# Patient Record
Sex: Female | Born: 1970 | Race: White | Hispanic: No | Marital: Single | Smoking: Former smoker
Health system: Southern US, Community
[De-identification: ages and names within clinical notes are randomized; demographics above are authoritative.]

## PROBLEM LIST (undated history)

## (undated) DIAGNOSIS — J45909 Unspecified asthma, uncomplicated: Secondary | ICD-10-CM

## (undated) DIAGNOSIS — R112 Nausea with vomiting, unspecified: Secondary | ICD-10-CM

## (undated) DIAGNOSIS — G5600 Carpal tunnel syndrome, unspecified upper limb: Secondary | ICD-10-CM

## (undated) DIAGNOSIS — J302 Other seasonal allergic rhinitis: Secondary | ICD-10-CM

## (undated) DIAGNOSIS — D649 Anemia, unspecified: Secondary | ICD-10-CM

## (undated) DIAGNOSIS — G709 Myoneural disorder, unspecified: Secondary | ICD-10-CM

## (undated) DIAGNOSIS — N200 Calculus of kidney: Secondary | ICD-10-CM

## (undated) DIAGNOSIS — T7840XA Allergy, unspecified, initial encounter: Secondary | ICD-10-CM

## (undated) DIAGNOSIS — Z9889 Other specified postprocedural states: Secondary | ICD-10-CM

## (undated) HISTORY — DX: Myoneural disorder, unspecified: G70.9

## (undated) HISTORY — PX: APPENDECTOMY: SHX54

## (undated) HISTORY — PX: HERNIA REPAIR: SHX51

## (undated) HISTORY — PX: TUBAL LIGATION: SHX77

## (undated) HISTORY — DX: Other specified postprocedural states: Z98.890

## (undated) HISTORY — DX: Anemia, unspecified: D64.9

## (undated) HISTORY — DX: Allergy, unspecified, initial encounter: T78.40XA

## (undated) HISTORY — DX: Unspecified asthma, uncomplicated: J45.909

## (undated) HISTORY — DX: Nausea with vomiting, unspecified: R11.2

## (undated) HISTORY — DX: Carpal tunnel syndrome, unspecified upper limb: G56.00

---

## 1999-07-22 ENCOUNTER — Other Ambulatory Visit: Admission: RE | Admit: 1999-07-22 | Discharge: 1999-07-22 | Payer: Self-pay | Admitting: Obstetrics and Gynecology

## 2000-05-18 ENCOUNTER — Other Ambulatory Visit: Admission: RE | Admit: 2000-05-18 | Discharge: 2000-05-18 | Payer: Self-pay | Admitting: Obstetrics and Gynecology

## 2001-05-03 ENCOUNTER — Ambulatory Visit: Admission: RE | Admit: 2001-05-03 | Discharge: 2001-05-03 | Payer: Self-pay | Admitting: Emergency Medicine

## 2001-05-26 ENCOUNTER — Other Ambulatory Visit: Admission: RE | Admit: 2001-05-26 | Discharge: 2001-05-26 | Payer: Self-pay | Admitting: Gynecology

## 2001-08-08 ENCOUNTER — Encounter: Payer: Self-pay | Admitting: Emergency Medicine

## 2001-08-08 ENCOUNTER — Emergency Department (HOSPITAL_COMMUNITY): Admission: EM | Admit: 2001-08-08 | Discharge: 2001-08-08 | Payer: Self-pay | Admitting: Emergency Medicine

## 2002-01-22 ENCOUNTER — Other Ambulatory Visit: Admission: RE | Admit: 2002-01-22 | Discharge: 2002-01-22 | Payer: Self-pay | Admitting: Gynecology

## 2002-09-03 ENCOUNTER — Other Ambulatory Visit: Admission: RE | Admit: 2002-09-03 | Discharge: 2002-09-03 | Payer: Self-pay | Admitting: Gynecology

## 2002-09-13 ENCOUNTER — Encounter: Admission: RE | Admit: 2002-09-13 | Discharge: 2002-12-12 | Payer: Self-pay | Admitting: Gynecology

## 2009-03-18 ENCOUNTER — Ambulatory Visit: Payer: Self-pay | Admitting: Advanced Practice Midwife

## 2009-03-18 ENCOUNTER — Inpatient Hospital Stay (HOSPITAL_COMMUNITY): Admission: AD | Admit: 2009-03-18 | Discharge: 2009-03-20 | Payer: Self-pay | Admitting: Obstetrics & Gynecology

## 2010-11-29 LAB — RH IMMUNE GLOB WKUP(>/=20WKS)(NOT WOMEN'S HOSP)

## 2010-11-29 LAB — DIFFERENTIAL
Basophils Absolute: 0 10*3/uL (ref 0.0–0.1)
Basophils Relative: 0 % (ref 0–1)
Eosinophils Absolute: 0 10*3/uL (ref 0.0–0.7)
Eosinophils Relative: 0 % (ref 0–5)
Lymphocytes Relative: 21 % (ref 12–46)
Lymphs Abs: 2.6 10*3/uL (ref 0.7–4.0)
Monocytes Absolute: 0.8 10*3/uL (ref 0.1–1.0)
Monocytes Relative: 7 % (ref 3–12)
Neutro Abs: 9.1 10*3/uL — ABNORMAL HIGH (ref 1.7–7.7)
Neutrophils Relative %: 72 % (ref 43–77)

## 2010-11-29 LAB — CBC
HCT: 24.2 % — ABNORMAL LOW (ref 36.0–46.0)
HCT: 27.8 % — ABNORMAL LOW (ref 36.0–46.0)
Hemoglobin: 7.9 g/dL — CL (ref 12.0–15.0)
Hemoglobin: 8.9 g/dL — ABNORMAL LOW (ref 12.0–15.0)
MCHC: 32.1 g/dL (ref 30.0–36.0)
MCV: 73.8 fL — ABNORMAL LOW (ref 78.0–100.0)
Platelets: 304 10*3/uL (ref 150–400)
RBC: 3.27 MIL/uL — ABNORMAL LOW (ref 3.87–5.11)
RBC: 3.76 MIL/uL — ABNORMAL LOW (ref 3.87–5.11)
RDW: 16 % — ABNORMAL HIGH (ref 11.5–15.5)
RDW: 16.4 % — ABNORMAL HIGH (ref 11.5–15.5)
WBC: 12.6 10*3/uL — ABNORMAL HIGH (ref 4.0–10.5)

## 2010-11-29 LAB — RAPID URINE DRUG SCREEN, HOSP PERFORMED
Barbiturates: NOT DETECTED
Opiates: NOT DETECTED
Tetrahydrocannabinol: NOT DETECTED

## 2010-11-29 LAB — ABO/RH: ABO/RH(D): O NEG

## 2010-11-29 LAB — TYPE AND SCREEN
ABO/RH(D): O NEG
Antibody Screen: NEGATIVE

## 2010-11-29 LAB — HEPATITIS B SURFACE ANTIGEN: Hepatitis B Surface Ag: NEGATIVE

## 2010-11-29 LAB — RUBELLA SCREEN: Rubella: 23.4 IU/mL — ABNORMAL HIGH

## 2010-11-29 LAB — RAPID HIV SCREEN (WH-MAU): Rapid HIV Screen: NONREACTIVE

## 2011-01-05 NOTE — Op Note (Signed)
Becky King, Becky King           ACCOUNT NO.:  [id number]   MEDICAL RECORD NO.:  [id number]          PATIENT TYPE:  INP   LOCATION:  9107                          FACILITY:  [facility]   PHYSICIAN:  Scheryl Darter, MD       DATE OF BIRTH:  1971/05/05   DATE OF PROCEDURE:  DATE OF DISCHARGE:  03/20/2009                               OPERATIVE REPORT   PROCEDURE:  Postpartum bilateral tubal ligation with Filshie clips.   PREOPERATIVE DIAGNOSIS:  Undesired fertility.   POSTOPERATIVE DIAGNOSIS:  Tubal sterilization.   SURGEON:  Scheryl Darter, MD   ASSISTANT:  Eula Fried, MD   ANESTHESIA:  General.   ESTIMATED BLOOD LOSS:  Negligible.   FINDINGS:  Normal tubes and ovaries.   SPECIMENS:  None.   DRAINS:  None.   COUNTS:  Correct.   OPERATIVE COURSE:  The patient gave written consent for postpartum  bilateral tubal ligation.  The patient identification was confirmed.  She was brought to the OR and general anesthesia was induced.  She was  placed in dorsal supine position.  Bladder was drained with a red rubber  catheter and abdomen sterilely prepped and draped.  A #11 blade was used  to make an infraumbilical skin incision transversely about 2 cm across.  Fascia was elevated, incised with Mayo scissors, and peritoneal cavity  was entered.  Right fallopian tube was identified and elevated and  traced to its distal fimbriated end.  Filshie clip was applied at about  the proximal one-third point of the tube with good positioning seen.  Same was done on the left fallopian tube.  Both tubes and ovaries  appeared normal.  Fascia and peritoneum were closed with a running  suture of 0 Vicryl.  Skin was closed with interrupted subcuticular  sutures with 4-0 Vicryl.  Sterile dressing was applied.  The patient  tolerated the procedure well without complications.  She was brought in  stable condition to the recovery room.     Scheryl Darter, MD  Electronically Signed    JA/MEDQ  D:   03/20/2009  T:  03/21/2009  Job:  [id number]

## 2011-10-26 ENCOUNTER — Encounter (HOSPITAL_COMMUNITY): Payer: Self-pay | Admitting: Emergency Medicine

## 2011-10-26 ENCOUNTER — Emergency Department (INDEPENDENT_AMBULATORY_CARE_PROVIDER_SITE_OTHER)
Admission: EM | Admit: 2011-10-26 | Discharge: 2011-10-26 | Disposition: A | Discharge status: Home / Self Care | Payer: 59 | Source: Home / Self Care | Attending: Emergency Medicine | Admitting: Emergency Medicine

## 2011-10-26 DIAGNOSIS — R21 Rash and other nonspecific skin eruption: Secondary | ICD-10-CM

## 2011-10-26 HISTORY — DX: Other seasonal allergic rhinitis: J30.2

## 2011-10-26 MED ORDER — TRIAMCINOLONE ACETONIDE 0.1 % EX CREA: TOPICAL_CREAM | Freq: Two times a day (BID) | CUTANEOUS | Status: AC

## 2011-10-26 MED ORDER — MUPIROCIN 2 % EX OINT: TOPICAL_OINTMENT | Freq: Three times a day (TID) | CUTANEOUS | Status: AC

## 2011-10-26 NOTE — ED Provider Notes (Signed)
History     CSN: [id number]  Arrival date & time 10/26/11  1550   First MD Initiated Contact with Patient 10/26/11 1705      Chief Complaint  Patient presents with  . Rash    (Consider location/radiation/quality/duration/timing/severity/associated sxs/prior treatment) HPI Comments: Patient reports a red, occasionally painful, occasionally itchy rash on her right lower extremity starting 2 months ago. Symptoms started after cutting herself shaving. States that the rash increased in size during this time. Was seen in another urgent care, sent home on 3 days of an unknown oral antibiotic which helped somewhat. Has been applying topical hydrocortisone and antibiotic ointment with minimal relief. Patient also reports an itchy rash over her waistline, where her jeans are, starting several weeks ago. States she switched detergents around that time. No other new lotions soaps detergents. No contacts with similar rash. No sensation of being bitten at night.  No recent medications. No nausea, vomiting, fevers. No history of diabetes.  ROS as noted in HPI. All other ROS negative.   Patient is a 41 y.o. female presenting with rash. The history is provided by the patient.  Rash  The current episode started more than 1 week ago. The problem has not changed since onset.There has been no fever. The rash is present on the right lower leg. Associated symptoms include itching and pain. Pertinent negatives include no blisters and no weeping. She has tried steriods and antibiotic cream for the symptoms. The treatment provided mild relief.    Past Medical History  Diagnosis Date  . Seasonal allergies     Past Surgical History  Procedure Date  . Tubal ligation     History reviewed. No pertinent family history.  History  Substance Use Topics  . Smoking status: Current Everyday Smoker -- 0.5 packs/day  . Smokeless tobacco: Not on file  . Alcohol Use: No    OB History    Grav Para Term Preterm  Abortions TAB SAB Ect Mult Living                  Review of Systems  Skin: Positive for itching and rash.    Allergies  Review of patient's allergies indicates no known allergies.  Home Medications   Current Outpatient Rx  Name Route Sig Dispense Refill  . FEXOFENADINE-PSEUDOEPHED ER 180-240 MG PO TB24 Oral Take 1 tablet by mouth daily.    Marland Kitchen MUPIROCIN 2 % EX OINT Topical Apply topically 3 (three) times daily. Apply after warm soak for 10 minutes 22 g 0  . TRIAMCINOLONE ACETONIDE 0.1 % EX CREA Topical Apply topically 2 (two) times daily. Apply for 2 weeks. 30 g 0    BP 128/79  Pulse 78  Temp(Src) 98.2 F (36.8 C) (Oral)  Resp 16  SpO2 98%  LMP 10/01/2011  Physical Exam  Nursing note and vitals reviewed. Constitutional: She is oriented to person, place, and time. She appears well-developed and well-nourished. No distress.  HENT:  Head: Normocephalic and atraumatic.  Eyes: Conjunctivae and EOM are normal.  Neck: Normal range of motion.  Cardiovascular: Normal rate.   Pulmonary/Chest: Effort normal.  Abdominal: She exhibits no distension.  Musculoskeletal: Normal range of motion.       Legs:      Flat, blanchable, erythema on right lower extremity. Nontender. Yellowish crusting over break in the skin. No edema. No erythema streaking up her leg.  Neurological: She is alert and oriented to person, place, and time.  Skin: Skin is warm and dry.  Rash noted.       Rash is noted on leg. Also with scattered excoriations at waistline. No other rash on torso, arms, in between fingers.  Psychiatric: She has a normal mood and affect. Her behavior is normal. Judgment and thought content normal.    ED Course  Procedures (including critical care time)  Labs Reviewed - No data to display No results found.   1. Rash       MDM  Rash on right lower extremity appears to be from chronic inflammation. Also appears to have staph infection. Will send home with medium potency steroid  and Bactroban. The other rash appears to be a contact dermatitis. We'll refer her to Dr. Derrill Kay, dermatologist on call if no improvement in 2 weeks.  Luiz Blare, MD 10/26/11 1910

## 2011-10-26 NOTE — Discharge Instructions (Signed)
Apply the medication as written. You will need to see a dermatologist if this does not resolve after treatment.   Go to www.goodrx.com to look up your medications. This will give you a list of where you can find your prescriptions at the most affordable prices.

## 2011-10-26 NOTE — ED Notes (Signed)
Bed:[facility]09<BR> Expected date:<BR> Expected time:<BR> Means of arrival:<BR> Comments:<BR>

## 2011-10-26 NOTE — ED Notes (Signed)
Pt. Has a reddened rash on her rt lower leg, and pt. Stated, some places on both side of her abdomen

## 2013-03-05 ENCOUNTER — Encounter (HOSPITAL_COMMUNITY): Payer: Self-pay | Admitting: Emergency Medicine

## 2013-03-05 DIAGNOSIS — T22219A Burn of second degree of unspecified forearm, initial encounter: Secondary | ICD-10-CM | POA: Insufficient documentation

## 2013-03-05 DIAGNOSIS — X131XXA Other contact with steam and other hot vapors, initial encounter: Secondary | ICD-10-CM | POA: Insufficient documentation

## 2013-03-05 DIAGNOSIS — X12XXXA Contact with other hot fluids, initial encounter: Secondary | ICD-10-CM | POA: Insufficient documentation

## 2013-03-05 DIAGNOSIS — F17200 Nicotine dependence, unspecified, uncomplicated: Secondary | ICD-10-CM | POA: Insufficient documentation

## 2013-03-05 DIAGNOSIS — Y92009 Unspecified place in unspecified non-institutional (private) residence as the place of occurrence of the external cause: Secondary | ICD-10-CM | POA: Insufficient documentation

## 2013-03-05 DIAGNOSIS — T310 Burns involving less than 10% of body surface: Secondary | ICD-10-CM | POA: Insufficient documentation

## 2013-03-05 DIAGNOSIS — Y93G1 Activity, food preparation and clean up: Secondary | ICD-10-CM | POA: Insufficient documentation

## 2013-03-05 NOTE — ED Notes (Signed)
PT. REPORTS HOT COOKING OIL SPLATTERED AT HER RIGHT ANTERIOR WRIST Becky King THIS EVENING WHILE FRYING , PRESENTS WITH REDDNESS /BLISTERS AT RIGHT ANTERIOR FOREARM/WRIST .

## 2013-03-05 NOTE — ED Notes (Signed)
COLD COMPRESS APPLIED BY PT. PTA.

## 2013-03-06 ENCOUNTER — Emergency Department (HOSPITAL_COMMUNITY)
Admission: EM | Admit: 2013-03-06 | Discharge: 2013-03-06 | Disposition: A | Discharge status: Home / Self Care | Payer: 59 | Attending: Emergency Medicine | Admitting: Emergency Medicine

## 2013-03-06 DIAGNOSIS — T2210XA Burn of first degree of shoulder and upper limb, except wrist and hand, unspecified site, initial encounter: Secondary | ICD-10-CM

## 2013-03-06 DIAGNOSIS — T2220XA Burn of second degree of shoulder and upper limb, except wrist and hand, unspecified site, initial encounter: Secondary | ICD-10-CM

## 2013-03-06 MED ORDER — SILVER SULFADIAZINE 1 % EX CREA
TOPICAL_CREAM | Freq: Once | CUTANEOUS | Status: AC
  Administered 2013-03-06: 02:00:00 via TOPICAL
  Filled 2013-03-06: qty 85

## 2013-03-06 MED ORDER — OXYCODONE-ACETAMINOPHEN 5-325 MG PO TABS: 1.0000 | ORAL_TABLET | ORAL | Status: DC | PRN

## 2013-03-06 NOTE — ED Provider Notes (Signed)
History    CSN: [id number] Arrival date & time 03/05/13  2155  First MD Initiated Contact with Patient 03/06/13 0138     Chief Complaint  Patient presents with  . Burn   HPI  History provided by the patient. Patient is a 42 year old female with no significant PMH presenting with burns from cooking to right forearm. Patient was cooking in frying foods around 9 PM when she suddenly heard a popping from the pain and with splashing of the oil onto her right forearm. She immediately rinsed in cold water. Since that time she has had redness of the skin and a few small blisters of the skin to the right wrist area. Skin and arm are tender. She has not used any other treatments or symptoms. She denies any weakness or numbness to the hand or fingers. No other aggravating or alleviating factors. No other associated symptoms.     Past Medical History  Diagnosis Date  . Seasonal allergies    Past Surgical History  Procedure Laterality Date  . Tubal ligation    . Appendectomy    . Hernia repair     No family history on file. History  Substance Use Topics  . Smoking status: Current Every Day Smoker -- 0.50 packs/day  . Smokeless tobacco: Not on file  . Alcohol Use: No   OB History   Grav Para Term Preterm Abortions TAB SAB Ect Mult Living                 Review of Systems  Constitutional: Negative for fever, chills and diaphoresis.  Skin:       Burn of right forearm  All other systems reviewed and are negative.    Allergies  Review of patient's allergies indicates no known allergies.  Home Medications   Current Outpatient Rx  Name  Route  Sig  Dispense  Refill  . fexofenadine-pseudoephedrine (ALLEGRA-D 24) 180-240 MG per 24 hr tablet   Oral   Take 1 tablet by mouth every morning.          . polyvinyl alcohol (LIQUIFILM TEARS) 1.4 % ophthalmic solution   Both Eyes   Place 1 drop into both eyes as needed. [employer] Lomb For dry eyes          BP 135/73  Pulse 101   Temp(Src) 98.1 F (36.7 C) (Oral)  Resp 16  SpO2 99%  LMP 02/20/2013 Physical Exam  Nursing note and vitals reviewed. Constitutional: She is oriented to person, place, and time. She appears well-developed and well-nourished. No distress.  HENT:  Head: Normocephalic.  Cardiovascular: Normal rate and regular rhythm.   Pulmonary/Chest: Effort normal and breath sounds normal. No respiratory distress. She has no wheezes.  Abdominal: Soft.  Musculoskeletal:  Normal full range of motion of the right wrist and digits. Normal radial pulse. Normal sensation and cap refill to all fingers. Normal grip strength. There are burns to the anterior right forearm, see skin exam. Mild tenderness over the burn.  Neurological: She is alert and oriented to person, place, and time.  Skin: Skin is warm and dry. No rash noted.     There is a diffuse area of erythema to the anterior right forearm. There are 2 small bulla to the right wrist area. Several satellite small circular areas of erythema nearby on the anterior and lateral forearm.  Psychiatric: She has a normal mood and affect. Her behavior is normal.    ED Course  Procedures  1. Burn of arm, right, first degree, initial encounter   2. Burn of arm, right, second degree, initial encounter     MDM  Patient seen and evaluated. Patient appears well in no acute distress. She has slightly larger diffuse burn over the anterior right forearm with several satellite circular spots. No circumferential burn. The majority of burn appears first degree    Angus Seller, PA-C 03/06/13 [phone]

## 2013-03-07 NOTE — ED Provider Notes (Signed)
Medical screening examination/treatment/procedure(s) were performed by non-physician practitioner and as supervising physician I was immediately available for consultation/collaboration.   Julie Manly, MD 03/07/13 0901 

## 2014-02-23 ENCOUNTER — Emergency Department (HOSPITAL_COMMUNITY)
Admission: EM | Admit: 2014-02-23 | Discharge: 2014-02-23 | Disposition: A | Discharge status: Home / Self Care | Payer: 59 | Attending: Emergency Medicine | Admitting: Emergency Medicine

## 2014-02-23 ENCOUNTER — Encounter (HOSPITAL_COMMUNITY): Payer: Self-pay | Admitting: Emergency Medicine

## 2014-02-23 ENCOUNTER — Emergency Department (HOSPITAL_COMMUNITY): Payer: 59

## 2014-02-23 DIAGNOSIS — Z91040 Latex allergy status: Secondary | ICD-10-CM | POA: Insufficient documentation

## 2014-02-23 DIAGNOSIS — J40 Bronchitis, not specified as acute or chronic: Secondary | ICD-10-CM | POA: Insufficient documentation

## 2014-02-23 DIAGNOSIS — Z792 Long term (current) use of antibiotics: Secondary | ICD-10-CM | POA: Insufficient documentation

## 2014-02-23 DIAGNOSIS — F17200 Nicotine dependence, unspecified, uncomplicated: Secondary | ICD-10-CM | POA: Insufficient documentation

## 2014-02-23 DIAGNOSIS — Z79899 Other long term (current) drug therapy: Secondary | ICD-10-CM | POA: Insufficient documentation

## 2014-02-23 DIAGNOSIS — J069 Acute upper respiratory infection, unspecified: Secondary | ICD-10-CM | POA: Insufficient documentation

## 2014-02-23 LAB — BASIC METABOLIC PANEL
Anion gap: 14 (ref 5–15)
BUN: 12 mg/dL (ref 6–23)
CHLORIDE: 103 meq/L (ref 96–112)
CO2: 23 meq/L (ref 19–32)
CREATININE: 0.44 mg/dL — AB (ref 0.50–1.10)
Calcium: 8.9 mg/dL (ref 8.4–10.5)
GFR calc Af Amer: >90 mL/min (ref >90)
GFR calc non Af Amer: >90 mL/min (ref >90)
Glucose, Bld: 98 mg/dL (ref 70–99)
Potassium: 4.4 mEq/L (ref 3.7–5.3)
Sodium: 140 mEq/L (ref 137–147)

## 2014-02-23 LAB — CBC
HEMATOCRIT: 34.2 % — AB (ref 36.0–46.0)
Hemoglobin: 10.7 g/dL — ABNORMAL LOW (ref 12.0–15.0)
MCH: 27.4 pg (ref 26.0–34.0)
MCHC: 31.3 g/dL (ref 30.0–36.0)
MCV: 87.7 fL (ref 78.0–100.0)
Platelets: 469 10*3/uL — ABNORMAL HIGH (ref 150–400)
RBC: 3.9 MIL/uL (ref 3.87–5.11)
RDW: 16.7 % — ABNORMAL HIGH (ref 11.5–15.5)
WBC: 9.7 10*3/uL (ref 4.0–10.5)

## 2014-02-23 LAB — RAPID STREP SCREEN (MED CTR MEBANE ONLY): Streptococcus, Group A Screen (Direct): NEGATIVE

## 2014-02-23 LAB — I-STAT TROPONIN, ED: Troponin i, poc: 0 ng/mL (ref 0.00–0.08)

## 2014-02-23 MED ORDER — ALBUTEROL SULFATE HFA 108 (90 BASE) MCG/ACT IN AERS
2.0000 | INHALATION_SPRAY | Freq: Once | RESPIRATORY_TRACT | Status: AC
  Administered 2014-02-23: 2 via RESPIRATORY_TRACT
  Filled 2014-02-23: qty 6.7

## 2014-02-23 MED ORDER — AZITHROMYCIN 250 MG PO TABS: 250.0000 mg | ORAL_TABLET | Freq: Every day | ORAL | Status: DC

## 2014-02-23 NOTE — ED Provider Notes (Signed)
CSN: [id number]     Arrival date & time 02/23/14  1456 History   First MD Initiated Contact with Patient 02/23/14 1503     Chief Complaint  Patient presents with  . URI     (Consider location/radiation/quality/duration/timing/severity/associated sxs/prior Treatment) Patient is a 43 y.o. female presenting with URI. The history is provided by the patient. No language interpreter was used.  URI Presenting symptoms: fever and sore throat   Associated symptoms: headaches   Associated symptoms: no neck pain   Becky King is a 43 y/o F with PMhx of seasonal allergies presenting to the ED with chest congestion, productive cough, nasal congestion, sinus pressure, sore throat is been ongoing for the past 4 days. Patient reports that when she coughs there is a yellowish colored sputum. Stated that she's been having sinus pressure and pressure around the eyes have since nasal congestion began. Reported that when she coughs his tightness in her chest-reported that she only experiences chest discomfort when she coughs. Stated that she has intermittent shortness of breath particularly with cough. Reported generalized body aches. Stated that she's been having intermittent fevers, reported that she had a fever last night at approximately 102F. Stated that she's been using over-the-counter medications such as Alka-Seltzer, Tylenol. Stated that her son has similar symptoms. Denied blurred vision, sudden loss of vision, abdominal pain, nausea, vomiting, diarrhea, leg swelling, travel, birth control, neck pain, neck stiffness, difficulty swallowing, hemoptysis. PCP none  Past Medical History  Diagnosis Date  . Seasonal allergies    Past Surgical History  Procedure Laterality Date  . Tubal ligation    . Appendectomy    . Hernia repair     History reviewed. No pertinent family history. History  Substance Use Topics  . Smoking status: Current Every Day Smoker -- 0.50 packs/day  . Smokeless tobacco:  Not on file  . Alcohol Use: No   OB History   Grav Para Term Preterm Abortions TAB SAB Ect Mult Living                 Review of Systems  Constitutional: Positive for fever. Negative for chills.  HENT: Positive for sinus pressure and sore throat. Negative for trouble swallowing.   Respiratory: Positive for chest tightness and shortness of breath.   Cardiovascular: Negative for chest pain.  Gastrointestinal: Negative for nausea, vomiting, abdominal pain and diarrhea.  Musculoskeletal: Negative for back pain and neck pain.  Neurological: Positive for headaches. Negative for weakness.      Allergies  Latex  Home Medications   Prior to Admission medications   Medication Sig Start Date End Date Taking? Authorizing Provider  acetaminophen (TYLENOL) 500 MG tablet Take 500 mg by mouth every 6 (six) hours as needed for moderate pain.   Yes Historical Provider, MD  fexofenadine-pseudoephedrine (ALLEGRA-D 24) 180-240 MG per 24 hr tablet Take 1 tablet by mouth every morning.    Yes Historical Provider, MD  guaiFENesin (ROBITUSSIN) 100 MG/5ML liquid Take 200 mg by mouth 3 (three) times daily as needed for cough.   Yes Historical Provider, MD  OVER THE COUNTER MEDICATION Apply 1 application topically 2 (two) times daily. Psoriasis lotion   Yes Historical Provider, MD  OVER THE COUNTER MEDICATION Apply 1 application topically daily. Psoriasis scalp solution   Yes Historical Provider, MD  polyvinyl alcohol (LIQUIFILM TEARS) 1.4 % ophthalmic solution Place 1 drop into both eyes as needed. [employer]ch [employer] Lomb For dry eyes   Yes Historical Provider, MD  Pseudoeph-CPM-DM-APAP Clarene Reamer(ALKA-SELTZER  PLUS-D SINUS/COLD) 30-2-10-325 MG CAPS Take 2 capsules by mouth daily as needed (for sinus).   Yes Historical Provider, MD  azithromycin (ZITHROMAX) 250 MG tablet Take 1 tablet (250 mg total) by mouth daily. Take first 2 tablets together, then 1 every day until finished. 02/23/14   Siddalee Vanderheiden, PA-C   BP 116/53   Pulse 88  Temp(Src) 98.2 F (36.8 C) (Oral)  Resp 18  Ht 5\' 2"  (1.575 m)  Wt 170 lb (77.111 kg)  BMI 31.09 kg/m2  SpO2 100%  LMP 02/23/2014 Physical Exam  Nursing note and vitals reviewed. Constitutional: She is oriented to person, place, and time. She appears well-developed and well-nourished. No distress.  Patient sitting comfortably upright in bed  HENT:  Head: Normocephalic and atraumatic.  Mouth/Throat: Oropharynx is clear and moist. No oropharyngeal exudate.  Bilateral tonsillar adenopathy identified with negative erythema, exudate, petechiae. Negative posterior oropharynx edema and erythema noted. Negative trismus. Uvula midline with symmetrical elevation. Negative deviations uvula.  Eyes: Conjunctivae and EOM are normal. Pupils are equal, round, and reactive to light. Right eye exhibits no discharge. Left eye exhibits no discharge.  Neck: Normal range of motion. Neck supple. No tracheal deviation present.  Negative neck stiffness Negative nuchal rigidity Negative cervical lymphadenopathy Negative meningeal signs Negative pain upon palpation to the C-spine  Cardiovascular: Normal rate, regular rhythm and normal heart sounds.  Exam reveals no friction rub.   No murmur heard. Cap refill less than 3 seconds Negative swelling and pitting edema identified to lower extremities bilaterally  Pulmonary/Chest: Effort normal and breath sounds normal. No respiratory distress. She has no wheezes. She has no rales. She exhibits no tenderness.  Patient is able to speak in full sentences without difficulty Negative use of accessory muscles Negative stridor  Musculoskeletal: Normal range of motion. She exhibits no tenderness.  Lymphadenopathy:    She has no cervical adenopathy.  Neurological: She is alert and oriented to person, place, and time. No cranial nerve deficit. She exhibits normal muscle tone. Coordination normal.  Cranial nerves III-XII grossly intact Strength 5+/5+ to upper and  lower extremities bilaterally with resistance applied, equal distribution noted Equal grip strength bilaterally Negative facial drooping Negative slurred speech Negative aphasia Patient follows commands well Gait proper, proper balance - negative sway, negative drift, negative step-offs  Skin: Skin is warm and dry. No rash noted. She is not diaphoretic. No erythema.  Psychiatric: She has a normal mood and affect. Her behavior is normal. Thought content normal.    ED Course  Procedures (including critical care time)  Results for orders placed during the hospital encounter of 02/23/14  RAPID STREP SCREEN      Result Value Ref Range   Streptococcus, Group A Screen (Direct) NEGATIVE  NEGATIVE  CBC      Result Value Ref Range   WBC 9.7  4.0 - 10.5 K/uL   RBC 3.90  3.87 - 5.11 MIL/uL   Hemoglobin 10.7 (*) 12.0 - 15.0 g/dL   HCT 45.434.2 (*) 09.836.0 - 11.946.0 %   MCV 87.7  78.0 - 100.0 fL   MCH 27.4  26.0 - 34.0 pg   MCHC 31.3  30.0 - 36.0 g/dL   RDW 14.716.7 (*) 82.911.5 - 56.215.5 %   Platelets 469 (*) 150 - 400 K/uL  BASIC METABOLIC PANEL      Result Value Ref Range   Sodium 140  137 - 147 mEq/L   Potassium 4.4  3.7 - 5.3 mEq/L   Chloride 103  96 -  112 mEq/L   CO2 23  19 - 32 mEq/L   Glucose, Bld 98  70 - 99 mg/dL   BUN 12  6 - 23 mg/dL   Creatinine, Ser 1.19 (*) 0.50 - 1.10 mg/dL   Calcium 8.9  8.4 - 14.7 mg/dL   GFR calc non Af Amer >90  >90 mL/min   GFR calc Af Amer >90  >90 mL/min   Anion gap 14  5 - 15  I-STAT TROPOININ, ED      Result Value Ref Range   Troponin i, poc 0.00  0.00 - 0.08 ng/mL   Comment 3             Labs Review Labs Reviewed  CBC - Abnormal; Notable for the following:    Hemoglobin 10.7 (*)    HCT 34.2 (*)    RDW 16.7 (*)    Platelets 469 (*)    All other components within normal limits  BASIC METABOLIC PANEL - Abnormal; Notable for the following:    Creatinine, Ser 0.44 (*)    All other components within normal limits  RAPID STREP SCREEN  CULTURE, GROUP A  STREP  I-STAT TROPOININ, ED    Imaging Review Dg Chest 2 View  02/23/2014   CLINICAL DATA:  1-2 week history of weakness and chest congestion; history of tobacco use.  EXAM: CHEST  2 VIEW  COMPARISON:  None.  FINDINGS: The lungs are well-expanded and clear. The heart and mediastinal structures are normal. There is no pleural effusion or pneumothorax. The bony thorax is unremarkable.  IMPRESSION: There is no acute cardiopulmonary abnormality.   Electronically Signed   By: David  [place]   On: 02/23/2014 16:46     EKG Interpretation   Date/Time:  Saturday February 23 2014 16:23:22 EDT Ventricular Rate:  93 PR Interval:  135 QRS Duration: 86 QT Interval:  364 QTC Calculation: 453 R Axis:   42 Text Interpretation:  Sinus rhythm Low voltage, precordial leads RSR' in  V1 or V2, right VCD or RVH Nonspecific T abnormalities, anterior leads No  prior for comparision Confirmed by DOCHERTY  MD, MEGAN [phone]) on 02/23/2014  4:27:45 PM      MDM   Final diagnoses:  URI (upper respiratory infection)  Bronchitis    Filed Vitals:   02/23/14 1504 02/23/14 1615 02/23/14 1624 02/23/14 1806  BP: 119/64 102/39 112/57 116/53  Pulse: 101 86 90 88  Temp: 98.2 F (36.8 C)     TempSrc: Oral     Resp: 16  20 18   Height: 5\' 2"  (1.575 m)     Weight: 170 lb (77.111 kg)     SpO2: 98% 98% 97% 100%   EKG noted normal sinus rhythm with a heart rate of 93 beats per minute-nonspecific T wave abnormalities. I-STAT troponin negative elevation. CBC negative elevated white blood cell count. BMP noted low creatinine 0.44. Electrolytes balanced. Rapid strep negative. Chest x-ray negative for acute cardiopulmonary abnormalities. Doubt streptococcal pharyngitis. Doubt retropharyngeal abscess. Doubt peritonsillar abscess. Suspicion to be upper respiratory infection, possible bronchitis. Discussed case with attending physician, Dr. Nonda Lou - agreed to plan of discharge. Patient stable, afebrile. Patient not septic  appearing. Discharged patient. Discussed with patient to rest and stay hydrated. Discussed with patient to avoid any physical stress activity. Discussed smoking cessation with patient in great detail. Discussed with patient to closely monitor symptoms and if symptoms are to worsen or change to report back to the ED - strict return instructions given.  Patient agreed to plan of care, understood, all questions answered.   Raymon Mutton, PA-C 02/23/14 1820  Raymon Mutton, PA-C 02/23/14 1823

## 2014-02-23 NOTE — Discharge Instructions (Signed)
Please call your doctor for a followup appointment within 24-48 hours. When you talk to your doctor please let them know that you were seen in the emergency department and have them acquire all of your records so that they can discuss the findings with you and formulate a treatment plan to fully care for your new and ongoing problems. Please call and set-up an appointment with [facility] to be seen and assessed Please take antibiotics as prescribed and on a full stomach Please avoid smoking for this can lead to worsening symptoms and is dangerous for your health  Please use albuterol as needed for shortness of breath  Please continue to monitor symptoms and if symptoms are to worsen or change (fever greater than 101, chills, sweating, nausea, vomiting, chest pain, shortness of breath, difficulty breathing, numbness, tingling, nausea, vomiting, stomach pain, weakness, fainting, leg swelling, coughing up blood) please report back to the ED immediately    Bronchitis Bronchitis is swelling (inflammation) of the air tubes leading to your lungs (bronchi). This causes mucus and a cough. If the swelling gets bad, you may have trouble breathing. HOME CARE   Rest.  Drink enough fluids to keep your pee (urine) clear or pale yellow (unless you have a condition where you have to watch how much you drink).  Only take medicine as told by your doctor. If you were given antibiotic medicines, finish them even if you start to feel better.  Avoid smoke, irritating chemicals, and strong smells. These make the problem worse. Quit smoking if you smoke. This helps your lungs heal faster.  Use a cool mist humidifier. Change the water in the humidifier every day. You can also sit in the bathroom with hot shower running for 5-10 minutes. Keep the door closed.  See your health care provider as told.  Wash your hands often. GET HELP IF: Your problems do not get better after 1 week. GET HELP RIGHT AWAY  IF:   Your fever gets worse.  You have chills.  Your chest hurts.  Your problems breathing get worse.  You have blood in your mucus.  You pass out (faint).  You feel light-headed.  You have a bad headache.  You throw up (vomit) again and again. MAKE SURE YOU:  Understand these instructions.  Will watch your condition.  Will get help right away if you are not doing well or get worse. Document Released: 01/26/2008 Document Revised: 08/14/2013 Document Reviewed: 04/03/2013 [facility] Patient Information 2015 [place], [place]. This information is not intended to replace advice given to you by your health care provider. Make sure you discuss any questions you have with your health care provider. Upper Respiratory Infection, Adult An upper respiratory infection (URI) is also known as the common cold. It is often caused by a type of germ (virus). Colds are easily spread (contagious). You can pass it to others by kissing, coughing, sneezing, or drinking out of the same glass. Usually, you get better in 1 or 2 weeks.  HOME CARE   Only take medicine as told by your doctor.  Use a warm mist humidifier or breathe in steam from a hot shower.  Drink enough water and fluids to keep your pee (urine) clear or pale yellow.  Get plenty of rest.  Return to work when your temperature is back to normal or as told by your doctor. You may use a face mask and wash your hands to stop your cold from spreading. GET HELP RIGHT AWAY IF:  After the first few days, you feel you are getting worse.  You have questions about your medicine.  You have chills, shortness of breath, or brown or red spit (mucus).  You have yellow or brown snot (nasal discharge) or pain in the face, especially when you bend forward.  You have a fever, puffy (swollen) neck, pain when you swallow, or white spots in the back of your throat.  You have a bad headache, ear pain, sinus pain, or chest pain.  You have a  high-pitched whistling sound when you breathe in and out (wheezing).  You have a lasting cough or cough up blood.  You have sore muscles or a stiff neck. MAKE SURE YOU:   Understand these instructions.  Will watch your condition.  Will get help right away if you are not doing well or get worse. Document Released: 01/26/2008 Document Revised: 11/01/2011 Document Reviewed: 12/14/2010 [facility] Patient Information 2015 [place], [place]. This information is not intended to replace advice given to you by your health care provider. Make sure you discuss any questions you have with your health care provider.   Emergency Department Resource Guide 1) Find a Doctor and Pay Out of Pocket Although you won't have to find out who is covered by your insurance plan, it is a good idea to ask around and get recommendations. You will then need to call the office and see if the doctor you have chosen will accept you as a new patient and what types of options they offer for patients who are self-pay. Some doctors offer discounts or will set up payment plans for their patients who do not have insurance, but you will need to ask so you aren't surprised when you get to your appointment.  2) Contact Your Local Health Department Not all health departments have doctors that can see patients for sick visits, but many do, so it is worth a call to see if yours does. If you don't know where your local health department is, you can check in your phone book. The CDC also has a tool to help you locate your state's health department, and many state websites also have listings of all of their local health departments.  3) Find a Walk-in Clinic If your illness is not likely to be very severe or complicated, you may want to try a walk in clinic. These are popping up all over the country in pharmacies, drugstores, and shopping centers. They're usually staffed by nurse practitioners or physician assistants that have been trained to  treat common illnesses and complaints. They're usually fairly quick and inexpensive. However, if you have serious medical issues or chronic medical problems, these are probably not your best option.  No Primary Care Doctor: - Call Health Connect at  [phone] - they can help you locate a primary care doctor that  accepts your insurance, provides certain services, etc. - Physician Referral Service- [phone]  Chronic Pain Problems: Organization         Address  Phone   Notes  [facility]  [phone] Patients need to be referred by their primary care doctor.   Medication Assistance: Organization         Address  Phone   Notes  [facility] Medication [facility] [address] [place] [place], [place] [address] [phone] --Must be a resident of [facility] -- Must have NO insurance coverage whatsoever (no Medicaid/ Medicare, etc.) -- The pt. MUST have a primary care doctor  that directs their care regularly and follows them in the community   [facility]ist  [phone]   [employer]  [phone]    Agencies that provide inexpensive medical care: Organization         Address  Phone   Notes  [facility]  [phone]   [facility]    [phone]   [facility] [address] [place], [place] [address] [phone]   [facility] [address] [place], [place] [phone]   Planned Parenthood    [phone]   [facility]    [phone]   [facility]  [address], [place] Phone:  [phone], Fax:  [phone] Hours of Operation:  9 am - 6 pm, M-F.  Also accepts Medicaid/Medicare and self-pay.  [facility] for [facility]  [address], [place] Phone: [phone], Fax: [phone]. Hours of Operation:  8:30 am - 5:30 pm, M-F.  Also accepts Medicaid and self-pay.  [facility]  [place] [address] [place] Phone: [phone]   [facility] [address] Natasha Bence [place], [place] [phone], Ext. [phone] Mondays & Thursdays: 7-9 AM.  First 15 patients are seen on a first come, first serve basis.    Medicaid-accepting [facility] Providers:  Organization         Address  Phone   Notes  [facility] [address], [place] [phone] Also accepts self-pay patients.  [facility] [address] Laurell Josephs [place], [place]  [phone]   [facility] [address], [place] [phone]   [facility] [address], [place] [phone]   Renaye Rakers [address], [place]   [phone] Only accepts [place] Access [place] patients after they have their name applied to their card.   Self-Pay (no insurance) in [facility] [place] [facility]:  Organization         Address  Phone   Notes  Sickle Cell Patients, [facility] [address] [place], [place] [phone]   [facility] [address] [place], [place] [phone]   [facility] [place]  [address], [place] [phone]   [facility]/Dr. Osei-Bonsu  [address] [place] or [address], [place] [phone] Phone number for both [place] and [place] locations is the same.  [facility]gent Medical and [facility] [address], [place] [phone]   [facility] [address], [place] or [address] [phone]   [facility] [address], [place] [phone], phone; [phone], fax Sees patients 1st and 3rd Saturday of every month.  Must not qualify for public or private insurance (i.e. Medicaid, Medicare, [place] Health Choice, Veterans' Benefits)  Household income should be no more than 200%  of the poverty level The clinic cannot treat you if you are pregnant or think you are pregnant  Sexually transmitted diseases are not treated at the clinic.    Dental Care: Organization         Address  Phone  Notes  [facility] [address] [place], [place] [phone] Accepts children  up to age 45 who are enrolled in Medicaid or [place] Health Choice; pregnant women with a Medicaid card; and children who have applied for Medicaid or [place] Health Choice, but were declined, whose parents can pay a reduced fee at time of service.  [facility] [place]  [address], [place] [phone] Accepts children up to age 45 who are enrolled in [place] or [place] Health Choice; pregnant women with a Medicaid card; and children who have applied for Medicaid or [place] Health Choice, but were declined, whose parents can pay a reduced fee at time of service.  [facility]  [address], [place] [phone] Patients are seen by appointment only. Walk-ins are not accepted. [facility] will see patients 45 years of age and older. Monday - Tuesday (8am-5pm) Most Wednesdays (8:30-5pm) $30 per visit, cash only  [facility]  [address], [facility] [phone] Patients are seen by appointment only. Walk-ins are not accepted. [facility] will see patients 45 years of age and older. One Wednesday Evening (Monthly: Volunteer Based).  $30 per visit, cash only  [employer] [facility] [facility]  [phone] for adults; Children under age 384, call Graduate Pediatric Dentistry at [phone]. Children aged 244-14, please call [phone] to request a pediatric application.  Dental services are provided in all areas of dental care including fillings, crowns and bridges, complete and partial dentures, implants, gum treatment, root canals, and  extractions. Preventive care is also provided. Treatment is provided to both adults and children. Patients are selected via a lottery and there is often a waiting list.   [facility] [address], [place]  [phone] [email or web address]   [facility] [address], [place], [place] [phone], Ext. [phone] Second and Fourth Thursday of each month, opens at 6:30 AM; Clinic ends at 9 AM.  Patients are seen on a first-come first-served basis, and a limited number are seen during each clinic.   [facility]  [address], [place], [place] [phone]) [phone]   Eligibility Requirements You must have lived in [place], [place], or [place] counties for at least the last three months.   You cannot be eligible for state or federal sponsored [place] Cityhealthcare insurance, including CIGNAVeterans Administration, [place]icaid, or [employer] EntertainmentMedicare.   You generally cannot be eligible for healthcare insurance through your employer.    How to apply: Eligibility screenings are held every Tuesday and Wednesday afternoon from 1:00 pm until 4:00 pm. You do not need an appointment for the interview!  [facility] [address], [place], [place] [phone]   [facility]  [phone]   [facility]  [phone]   [facility]  [phone]    Behavioral Health Resources in the Community: Intensive Outpatient Programs Organization         Address  Phone  Notes  [facility] [address], [place], [place] [phone]   [facility] [address], [place], [place] [phone]   [facility] [address], [place], [place]  [phone]   [facility] [address],  [place], [place] [phone] or [phone]   Substance Abuse Resources Organization         Address  Phone  Notes  [facility]  [phone]  [facility]  [phone]   [facility] [place]  [phone]   Floydene Flock  [phone]   Residential & Outpatient Substance Abuse Program  [phone]   Psychological Services Organization         Address  Phone  Notes  [facility]  [phone]   [facility]  [phone]   [facility] [place] [facility] [address] [place] [phone] or [phone]    Mobile Crisis Teams Organization         Address  Phone  Notes  [facility], Mobile Crisis Care Unit  [phone]   [facility]  [address] [place], [place] [phone]   Doristine Locks [address] [place] [phone]    Self-Help/Support Groups Organization         Address  Phone             Notes  [facility] [place] - variety of support groups  336- I7437963 Call for more information  Narcotics Anonymous (NA), [facility] [address], [employer] [place]  2 meetings at this location   Statistician         Address  Phone  Notes  [facility] [address],    [place]  [phone]   [facility]  [address] [place] [address], [place], [place] [phone]   [facility] [address] [place], [place] [phone] Admissions: 8am-3pm M-F  [facility] [address]    [place], [place] [phone]   [facility] [address] [place], [place], [place] [phone]   [facility] [address],  [place], [place] [phone]   [facility] - Intensive Outpatient [address] Laurell Josephs [address], [place], [place] [phone]   [facility] [address] [place]  [place], [place] [phone] or [phone]   [facility] ([facility]) [address] [place], [place] [phone] Accepts Medicaid  [facility] [address],   [place] [phone] Substance Abuse/Addiction Treatment   [facility] Organization         Address  Phone  Notes  [facility]  [phone]   Angie Fava, PhD [address] Ervin Knack [place], [place]   [phone] or [phone]   [facility] Behavioral   [address] [place], [place] [phone]   [facility] [address] [place], [place] [phone] Insurance/Medicaid/sponsorship through [facility] [address]                                    [place], [place] [phone] Therapy/tele-psych/case  [facility] [address] [place], [place] [phone]    Dr. Lolly Mustache  [phone]   [facility]  [facility] Dept. 1) [address], [place] 2) [address] [place] 3)  [address], [place] [phone]  [phone]   [facility] Child Abuse Hotline (660)823-5358 or [phone] (After Hours)

## 2014-02-23 NOTE — ED Notes (Signed)
Phlebotomy at the bedside  

## 2014-02-23 NOTE — ED Notes (Signed)
She states shes had a productive cough, nasal congestion, sore throat, chills, and body aches x 3 days. She took OTC col/allergy meds with no relief.

## 2014-02-24 NOTE — ED Provider Notes (Signed)
Medical screening examination/treatment/procedure(s) were performed by non-physician practitioner and as supervising physician I was immediately available for consultation/collaboration.  Megan E Docherty, MD 02/24/14 1219 

## 2014-02-25 LAB — CULTURE, GROUP A STREP

## 2016-04-25 ENCOUNTER — Emergency Department (HOSPITAL_COMMUNITY)
Admission: EM | Admit: 2016-04-25 | Discharge: 2016-04-25 | Disposition: A | Discharge status: Home / Self Care | Payer: 59 | Attending: Emergency Medicine | Admitting: Emergency Medicine

## 2016-04-25 DIAGNOSIS — J069 Acute upper respiratory infection, unspecified: Secondary | ICD-10-CM | POA: Diagnosis not present

## 2016-04-25 DIAGNOSIS — R05 Cough: Secondary | ICD-10-CM | POA: Diagnosis present

## 2016-04-25 DIAGNOSIS — Z91040 Latex allergy status: Secondary | ICD-10-CM | POA: Insufficient documentation

## 2016-04-25 DIAGNOSIS — B9789 Other viral agents as the cause of diseases classified elsewhere: Secondary | ICD-10-CM

## 2016-04-25 DIAGNOSIS — F17200 Nicotine dependence, unspecified, uncomplicated: Secondary | ICD-10-CM | POA: Diagnosis not present

## 2016-04-25 MED ORDER — ALBUTEROL SULFATE HFA 108 (90 BASE) MCG/ACT IN AERS
2.0000 | INHALATION_SPRAY | Freq: Once | RESPIRATORY_TRACT | Status: AC
  Administered 2016-04-25: 2 via RESPIRATORY_TRACT
  Filled 2016-04-25: qty 6.7

## 2016-04-25 MED ORDER — IBUPROFEN 800 MG PO TABS: 800.0000 mg | ORAL_TABLET | Freq: Three times a day (TID) | ORAL | 0 refills | Status: DC | PRN

## 2016-04-25 MED ORDER — IBUPROFEN 800 MG PO TABS
800.0000 mg | ORAL_TABLET | Freq: Once | ORAL | Status: AC
  Administered 2016-04-25: 800 mg via ORAL
  Filled 2016-04-25: qty 1

## 2016-04-25 MED ORDER — GUAIFENESIN-DM 100-10 MG/5ML PO SYRP
5.0000 mL | ORAL_SOLUTION | ORAL | 0 refills | Status: DC | PRN
Start: 2016-04-25 — End: 2018-03-14

## 2016-04-25 NOTE — ED Triage Notes (Signed)
Pt states that she has had generalized body aches and cough with nasal drainage since yesterday. Alert and oriented.

## 2016-04-25 NOTE — ED Provider Notes (Signed)
[facility] Provider Note   CSN: [id number] Arrival date & time: 04/25/16  1920   By signing my name below, I, Clovis PuAvnee Patel, attest that this documentation has been prepared under the direction and in the presence of  [facility], IncEmily Zenith Kercheval, PA-C. Electronically Signed: Clovis PuAvnee Patel, ED Scribe. 04/25/16. 8:57 PM.   History   Chief Complaint Chief Complaint  Patient presents with  . Cough  . Generalized Body Aches    The history is provided by the patient. No language interpreter was used.    Becky King is a 45 y.o. female who presents to the Emergency Department complaining of moderate, unchanged sore throat onset yesterday with associated productive cough, generalized myalgias and congestion. Pt states she has body aches and some mild, occasional ear pain. Pt denies SOB and fever. Pt has taken Nyquil with mild relief.   Past Medical History:  Diagnosis Date  . Seasonal allergies     There are no active problems to display for this patient.   Past Surgical History:  Procedure Laterality Date  . APPENDECTOMY    . HERNIA REPAIR    . TUBAL LIGATION      OB History    No data available       Home Medications    Prior to Admission medications   Medication Sig Start Date End Date Taking? Authorizing Provider  acetaminophen (TYLENOL) 500 MG tablet Take 500 mg by mouth every 6 (six) hours as needed for moderate pain.    Historical Provider, MD  fexofenadine-pseudoephedrine (ALLEGRA-D 24) 180-240 MG per 24 hr tablet Take 1 tablet by mouth every morning.     Historical Provider, MD  guaiFENesin-dextromethorphan (ROBITUSSIN DM) 100-10 MG/5ML syrup Take 5 mLs by mouth every 4 (four) hours as needed for cough (and congestion). 04/25/16   Trixie DredgeEmily Elion Hocker, PA-C  ibuprofen (ADVIL,MOTRIN) 800 MG tablet Take 1 tablet (800 mg total) by mouth every 8 (eight) hours as needed for mild pain or moderate pain. 04/25/16   Trixie DredgeEmily Virtie Bungert, PA-C  OVER THE COUNTER MEDICATION Apply 1 application topically  2 (two) times daily. Psoriasis lotion    Historical Provider, MD  OVER THE COUNTER MEDICATION Apply 1 application topically daily. Psoriasis scalp solution    Historical Provider, MD  polyvinyl alcohol (LIQUIFILM TEARS) 1.4 % ophthalmic solution Place 1 drop into both eyes as needed. [employer] For dry eyes    Historical Provider, MD    Family History No family history on file.  Social History Social History  Substance Use Topics  . Smoking status: Current Every Day Smoker    Packs/day: 0.50  . Smokeless tobacco: Not on file  . Alcohol use No     Allergies   Latex   Review of Systems Review of Systems  Constitutional: Negative for chills and fever.  HENT: Positive for congestion, rhinorrhea and sore throat. Negative for ear pain, facial swelling and trouble swallowing.   Respiratory: Positive for cough. Negative for shortness of breath.   Musculoskeletal: Positive for myalgias.  Skin: Negative for rash.  Allergic/Immunologic: Negative for immunocompromised state.     Physical Exam Updated Vital Signs BP 121/61 (BP Location: Right Arm)   Pulse 96   Temp 99.1 F (37.3 C) (Oral)   Resp 18   LMP 04/04/2016 (Approximate)   SpO2 99%   Physical Exam  Constitutional: She appears well-developed and well-nourished. No distress.  HENT:  Head: Normocephalic and atraumatic.  Nose: Mucosal edema present. Right sinus exhibits maxillary sinus tenderness. Right  sinus exhibits no frontal sinus tenderness. Left sinus exhibits maxillary sinus tenderness. Left sinus exhibits no frontal sinus tenderness.  Mouth/Throat: Uvula is midline. No trismus in the jaw. No uvula swelling. Posterior oropharyngeal erythema present. No oropharyngeal exudate, posterior oropharyngeal edema or tonsillar abscesses.  Erythema and edema of nasal turbinates  Mild erythema of her posterior oropharynx.   Eyes: Conjunctivae are normal.  Neck: Normal range of motion. Neck supple.  Cardiovascular: Normal  rate and regular rhythm.   Pulmonary/Chest: Effort normal and breath sounds normal. No respiratory distress. She has no wheezes. She has no rales.  Neurological: She is alert.  Skin: She is not diaphoretic.  Nursing note and vitals reviewed.    ED Treatments / Results  DIAGNOSTIC STUDIES:  Oxygen Saturation is 99% on room air, normal by my interpretation.    COORDINATION OF CARE:  8:48 PM Symptomatic therapy. Discussed treatment plan with pt at bedside and pt agreed to plan.  Labs (all labs ordered are listed, but only abnormal results are displayed) Labs Reviewed - No data to display  EKG  EKG Interpretation None       Radiology No results found.  Procedures Procedures (including critical care time)  Medications Ordered in ED Medications  ibuprofen (ADVIL,MOTRIN) tablet 800 mg (800 mg Oral Given 04/25/16 2107)  albuterol (PROVENTIL HFA;VENTOLIN HFA) 108 (90 Base) MCG/ACT inhaler 2 puff (2 puffs Inhalation Given 04/25/16 2108)     Initial Impression / Assessment and Plan / ED Course  I have reviewed the triage vital signs and the nursing notes.  Pertinent labs & imaging results that were available during my care of the patient were reviewed by me and considered in my medical decision making (see chart for details).  Clinical Course    Afebrile, nontoxic patient with constellation of symptoms suggestive of viral syndrome.  No concerning findings on exam.  Discharged home with supportive care, PCP follow up.  Discussed result, findings, treatment, and follow up  with patient.  Pt given return precautions.  Pt verbalizes understanding and agrees with plan.      Final Clinical Impressions(s) / ED Diagnoses   Final diagnoses:  Viral URI with cough    New Prescriptions Discharge Medication List as of 04/25/2016  8:52 PM    START taking these medications   Details  guaiFENesin-dextromethorphan (ROBITUSSIN DM) 100-10 MG/5ML syrup Take 5 mLs by mouth every 4 (four)  hours as needed for cough (and congestion)., Starting Sun 04/25/2016, Print    ibuprofen (ADVIL,MOTRIN) 800 MG tablet Take 1 tablet (800 mg total) by mouth every 8 (eight) hours as needed for mild pain or moderate pain., Starting Sun 04/25/2016, Print        I personally performed the services described in this documentation, which was scribed in my presence. The recorded information has been reviewed and is accurate.     Trixie Dredge, PA-C 04/25/16 2144    Loren Racer, MD 04/25/16 [phone]

## 2016-04-25 NOTE — Discharge Instructions (Signed)
Read the information below.  Use the prescribed medication as directed.  Please discuss all new medications with your pharmacist.  You may return to the Emergency Department at any time for worsening condition or any new symptoms that concern you.  If you develop high fevers that do not resolve with tylenol or ibuprofen, you have difficulty swallowing or breathing, or you are unable to tolerate fluids by mouth, return to the ER for a recheck.    °

## 2016-05-30 ENCOUNTER — Encounter (HOSPITAL_COMMUNITY): Payer: Self-pay | Admitting: Emergency Medicine

## 2016-05-30 ENCOUNTER — Ambulatory Visit (HOSPITAL_COMMUNITY)
Admission: EM | Admit: 2016-05-30 | Discharge: 2016-05-30 | Disposition: A | Discharge status: Home / Self Care | Payer: 59

## 2016-05-30 DIAGNOSIS — S5002XA Contusion of left elbow, initial encounter: Secondary | ICD-10-CM

## 2016-05-30 DIAGNOSIS — S20212A Contusion of left front wall of thorax, initial encounter: Secondary | ICD-10-CM

## 2016-05-30 NOTE — ED Provider Notes (Signed)
[facility]    CSN: [id number] Arrival date & time: 05/30/16  1636     History   Chief Complaint Chief Complaint  Patient presents with  . Fall    HPI Becky King is a 45 y.o. female.   HPI Patient comes in with left arm and left side chest pain.  States that 3 days ago she lost her balance and fell down 3 steps onto a concrete floor landing on her left side.  States that she has some soreness around left breast and this is aggravated with shoulder movement.  No pain in the shoulder itself.  Has some left lat elbow soreness and a small bruise.  Denies neck pain, back pain, or numbness/tingling.  Denies difficulty breathing.   Past Medical History:  Diagnosis Date  . Seasonal allergies     There are no active problems to display for this patient.   Past Surgical History:  Procedure Laterality Date  . APPENDECTOMY    . HERNIA REPAIR    . TUBAL LIGATION      OB History    No data available       Home Medications    Prior to Admission medications   Medication Sig Start Date End Date Taking? Authorizing Provider  fexofenadine-pseudoephedrine (ALLEGRA-D 24) 180-240 MG per 24 hr tablet Take 1 tablet by mouth every morning.    Yes Historical Provider, MD  acetaminophen (TYLENOL) 500 MG tablet Take 500 mg by mouth every 6 (six) hours as needed for moderate pain.    Historical Provider, MD  guaiFENesin-dextromethorphan (ROBITUSSIN DM) 100-10 MG/5ML syrup Take 5 mLs by mouth every 4 (four) hours as needed for cough (and congestion). 04/25/16   Trixie Dredge, PA-C  ibuprofen (ADVIL,MOTRIN) 800 MG tablet Take 1 tablet (800 mg total) by mouth every 8 (eight) hours as needed for mild pain or moderate pain. 04/25/16   Trixie Dredge, PA-C  OVER THE COUNTER MEDICATION Apply 1 application topically 2 (two) times daily. Psoriasis lotion    Historical Provider, MD  OVER THE COUNTER MEDICATION Apply 1 application topically daily. Psoriasis scalp solution    Historical Provider,  MD  polyvinyl alcohol (LIQUIFILM TEARS) 1.4 % ophthalmic solution Place 1 drop into both eyes as needed. [employer] For dry eyes    Historical Provider, MD    Family History History reviewed. No pertinent family history.  Social History Social History  Substance Use Topics  . Smoking status: Current Every Day Smoker    Packs/day: 0.50    Years: 15.00  . Smokeless tobacco: Never Used  . Alcohol use No     Allergies   Latex   Review of Systems Review of Systems  Constitutional: Negative.   HENT: Negative.   Eyes: Negative.   Respiratory: Negative for shortness of breath.   Cardiovascular: Negative.   Gastrointestinal: Negative.   Genitourinary: Negative.   Neurological: Negative.   Psychiatric/Behavioral: Negative.      Physical Exam Triage Vital Signs ED Triage Vitals  Enc Vitals Group     BP 05/30/16 1724 105/62     Pulse Rate 05/30/16 1724 71     Resp 05/30/16 1724 16     Temp 05/30/16 1724 98.1 F (36.7 C)     Temp Source 05/30/16 1724 Oral     SpO2 05/30/16 1724 100 %     Weight --      Height --      Head Circumference --  Peak Flow --      Pain Score 05/30/16 1733 7     Pain Loc --      Pain Edu? --      Excl. in GC? --    No data found.   Updated Vital Signs BP 105/62 (BP Location: Left Arm)   Pulse 71   Temp 98.1 F (36.7 C) (Oral)   Resp 16   SpO2 100%   Visual Acuity Right Eye Distance:   Left Eye Distance:   Bilateral Distance:    Right Eye Near:   Left Eye Near:    Bilateral Near:     Physical Exam  Constitutional: She is oriented to person, place, and time. She appears well-developed. No distress.  HENT:  Head: Normocephalic and atraumatic.  Eyes: EOM are normal. Pupils are equal, round, and reactive to light.  Neck: Normal range of motion.  Cardiovascular: Normal rate.   Pulmonary/Chest: Effort normal and breath sounds normal. No respiratory distress. She exhibits tenderness.  Abdominal: She exhibits no  distension.  Musculoskeletal:  bilat shoulder exam unremarkable.  Neg impingement test.  Left elbow good ROM.  Slight lateral elbow tenderness.  No deformity.  Small bruise lat proximal forearm.    Neurological: She is alert and oriented to person, place, and time.  Skin: Skin is warm and dry.  Psychiatric: She has a normal mood and affect.     UC Treatments / Results  Labs (all labs ordered are listed, but only abnormal results are displayed) Labs Reviewed - No data to display  EKG  EKG Interpretation None       Radiology No results found.  Procedures Procedures (including critical care time)  Medications Ordered in UC Medications - No data to display   Initial Impression / Assessment and Plan / UC Course  I have reviewed the triage vital signs and the nursing notes.  Pertinent labs & imaging results that were available during my care of the patient were reviewed by me and considered in my medical decision making (see chart for details).  Clinical Course     Final Clinical Impressions(s) / UC Diagnoses   Final diagnoses:  Contusion of left chest wall, initial encounter  Contusion of left elbow, initial encounter    New Prescriptions New Prescriptions   No medications on file  recommend conservative management.  Can use ice off and on.  OTC nsaid as needed.  Can schedule follow up with [facility] in one week for recheck.  All questions answered.  Advised patient that this could take a couple of weeks to resolve.     Naida SleightJames M Tymier Lindholm, PA-C 05/30/16 1753

## 2016-05-30 NOTE — ED Triage Notes (Signed)
[facility] a [facility] left arm pain and chest wall pain that radiates to her back secondary to a fall down 3 stairs three days ago.

## 2016-07-01 ENCOUNTER — Encounter (HOSPITAL_COMMUNITY): Payer: Self-pay | Admitting: Emergency Medicine

## 2016-07-01 ENCOUNTER — Emergency Department (HOSPITAL_COMMUNITY): Payer: 59

## 2016-07-01 DIAGNOSIS — W08XXXA Fall from other furniture, initial encounter: Secondary | ICD-10-CM | POA: Insufficient documentation

## 2016-07-01 DIAGNOSIS — Y92009 Unspecified place in unspecified non-institutional (private) residence as the place of occurrence of the external cause: Secondary | ICD-10-CM | POA: Diagnosis not present

## 2016-07-01 DIAGNOSIS — S4991XA Unspecified injury of right shoulder and upper arm, initial encounter: Secondary | ICD-10-CM | POA: Diagnosis present

## 2016-07-01 DIAGNOSIS — S42201A Unspecified fracture of upper end of right humerus, initial encounter for closed fracture: Secondary | ICD-10-CM | POA: Insufficient documentation

## 2016-07-01 DIAGNOSIS — Y999 Unspecified external cause status: Secondary | ICD-10-CM | POA: Insufficient documentation

## 2016-07-01 DIAGNOSIS — F17210 Nicotine dependence, cigarettes, uncomplicated: Secondary | ICD-10-CM | POA: Diagnosis not present

## 2016-07-01 DIAGNOSIS — Y939 Activity, unspecified: Secondary | ICD-10-CM | POA: Diagnosis not present

## 2016-07-01 NOTE — ED Triage Notes (Addendum)
Pt. lost her balance and fell from a step stool at home this evening , denies LOC/ambulatory , reports pain at right shoulder joint radiating to right upper arm , pain increases with movement / changing positions .

## 2016-07-02 ENCOUNTER — Emergency Department (HOSPITAL_COMMUNITY): Payer: 59

## 2016-07-02 ENCOUNTER — Emergency Department (HOSPITAL_COMMUNITY)
Admission: EM | Admit: 2016-07-02 | Discharge: 2016-07-02 | Disposition: A | Discharge status: Home / Self Care | Payer: 59 | Attending: Emergency Medicine | Admitting: Emergency Medicine

## 2016-07-02 DIAGNOSIS — S42201A Unspecified fracture of upper end of right humerus, initial encounter for closed fracture: Secondary | ICD-10-CM

## 2016-07-02 DIAGNOSIS — W19XXXA Unspecified fall, initial encounter: Secondary | ICD-10-CM

## 2016-07-02 MED ORDER — OXYCODONE-ACETAMINOPHEN 5-325 MG PO TABS: 1.0000 | ORAL_TABLET | Freq: Three times a day (TID) | ORAL | 0 refills | Status: DC | PRN

## 2016-07-02 MED ORDER — OXYCODONE-ACETAMINOPHEN 5-325 MG PO TABS
1.0000 | ORAL_TABLET | Freq: Once | ORAL | Status: AC
  Administered 2016-07-02: 1 via ORAL
  Filled 2016-07-02: qty 1

## 2016-07-02 NOTE — Progress Notes (Signed)
Orthopedic Tech Progress Note Patient Details:  Becky MedinStephanie C King 11/22/1970 [id number]  Ortho Devices Type of Ortho Device: Shoulder immobilizer Ortho Device/Splint Location: Shoulder Immobilizer/ RUE Ortho Device/Splint Interventions: Application, Adjustment   Alvina ChouWilliams, Shiv Shuey C 07/02/2016, 4:42 AM

## 2016-07-02 NOTE — ED Provider Notes (Signed)
[facility] Provider Note   CSN: [id number] Arrival date & time: 07/01/16  2212     History   Chief Complaint Chief Complaint  Patient presents with  . Fall    HPI Becky King is a 45 y.o. female.  The history is provided by the patient and medical records. No language interpreter was used.   Becky King is an otherwise healthy 45 y.o. female  who presents to the Emergency Department complaining of right shoulder pain which began one hour prior to arrival after a mechanical fall. Patient states that she was on a step stool cleaning the kitchen when she lost her balance and the step stool came out from underneath her feet. She doesn't remember hitting her shoulder directly, but is unsure. She believes she may have hit her right elbow against the counter, but did feel a pop in her right shoulder with acute onset of "excrutiating pain". Did not hit head or have LOC. No medications taken prior to arrival for symptoms. No numbness or tingling. No prior injuries to the RUE. Never seen an orthopedist in the past.    Past Medical History:  Diagnosis Date  . Seasonal allergies     There are no active problems to display for this patient.   Past Surgical History:  Procedure Laterality Date  . APPENDECTOMY    . HERNIA REPAIR    . TUBAL LIGATION      OB History    No data available       Home Medications    Prior to Admission medications   Medication Sig Start Date End Date Taking? Authorizing Provider  acetaminophen (TYLENOL) 500 MG tablet Take 500 mg by mouth every 6 (six) hours as needed for moderate pain.    Historical Provider, MD  fexofenadine-pseudoephedrine (ALLEGRA-D 24) 180-240 MG per 24 hr tablet Take 1 tablet by mouth every morning.     Historical Provider, MD  guaiFENesin-dextromethorphan (ROBITUSSIN DM) 100-10 MG/5ML syrup Take 5 mLs by mouth every 4 (four) hours as needed for cough (and congestion). 04/25/16   Trixie Dredge, PA-C  ibuprofen  (ADVIL,MOTRIN) 800 MG tablet Take 1 tablet (800 mg total) by mouth every 8 (eight) hours as needed for mild pain or moderate pain. 04/25/16   Trixie Dredge, PA-C  OVER THE COUNTER MEDICATION Apply 1 application topically 2 (two) times daily. Psoriasis lotion    Historical Provider, MD  OVER THE COUNTER MEDICATION Apply 1 application topically daily. Psoriasis scalp solution    Historical Provider, MD  oxyCODONE-acetaminophen (PERCOCET/ROXICET) 5-325 MG tablet Take 1 tablet by mouth every 8 (eight) hours as needed for severe pain. 07/02/16   Chase Picket Ward, PA-C  polyvinyl alcohol (LIQUIFILM TEARS) 1.4 % ophthalmic solution Place 1 drop into both eyes as needed. [employer] For dry eyes    Historical Provider, MD    Family History No family history on file.  Social History Social History  Substance Use Topics  . Smoking status: Current Every Day Smoker    Packs/day: 0.00    Years: 0.00    Types: Cigarettes  . Smokeless tobacco: Never Used  . Alcohol use No     Allergies   Latex   Review of Systems Review of Systems  Constitutional: Negative for fever.  HENT: Negative for congestion.   Eyes: Negative for visual disturbance.  Respiratory: Negative for shortness of breath.   Cardiovascular: Negative.   Gastrointestinal: Negative for abdominal pain, nausea and vomiting.  Musculoskeletal: Positive for  arthralgias.  Skin: Negative for wound.  Neurological: Negative for dizziness and headaches.  Hematological: Does not bruise/bleed easily.     Physical Exam Updated Vital Signs BP 105/59   Pulse 71   Temp 98.3 F (36.8 C) (Oral)   Resp 18   Ht 5\' 2"  (1.575 m)   Wt 66.7 kg   LMP 06/24/2016 (Approximate)   SpO2 99%   BMI 26.89 kg/m   Physical Exam  Constitutional: She is oriented to person, place, and time. She appears well-developed and well-nourished. No distress.  HENT:  Head: Normocephalic and atraumatic.  Neck:  No midline cervical tenderness.  Cardiovascular:  Normal rate, regular rhythm and normal heart sounds.   No murmur heard. Pulmonary/Chest: Effort normal and breath sounds normal. No respiratory distress.  Abdominal: Soft. She exhibits no distension. There is no tenderness.  Musculoskeletal:  Right shoulder : TTP of anterior shoulder and proximal humerus. Minimal swelling. Decreased ROM. No erythema or ecchymosis present. No step-off, crepitus, or deformity appreciated. Good grip strength and pincer grasp. 2+ radial pulse, sensation intact, all compartments soft.    Neurological: She is alert and oriented to person, place, and time.  Skin: Skin is warm and dry.  Nursing note and vitals reviewed.    ED Treatments / Results  Labs (all labs ordered are listed, but only abnormal results are displayed) Labs Reviewed - No data to display  EKG  EKG Interpretation None       Radiology Dg Shoulder Right  Result Date: 07/01/2016 CLINICAL DATA:  45 y/o F; status post fall with right shoulder pain radiating down to the right arm. EXAM: RIGHT SHOULDER - 2+ VIEW COMPARISON:  None. FINDINGS: Minimally displaced fracture through the greater tuberosity of the proximal humerus. No additional fracture is identified. No evidence for shoulder dislocation. IMPRESSION: Minimally displaced fracture through the greater tuberosity of the proximal humerus. No additional fracture identified. No dislocation. Electronically Signed   By: Mitzi HansenLance  Furusawa-Stratton M.D.   On: 07/01/2016 22:52   Dg Elbow Complete Right  Result Date: 07/02/2016 CLINICAL DATA:  Right elbow pain.  Fell 1 day ago. EXAM: RIGHT ELBOW - COMPLETE 3+ VIEW COMPARISON:  None. FINDINGS: Examination is technically limited due to nonstandard patient positioning. No acute fracture or dislocation is visualized. No significant effusion. No focal bone lesion or bone destruction. Soft tissues are unremarkable. IMPRESSION: No acute bony abnormalities demonstrated. Electronically Signed   By: Burman NievesWilliam   Stevens M.D.   On: 07/02/2016 03:01    Procedures Procedures (including critical care time)  Medications Ordered in ED Medications  oxyCODONE-acetaminophen (PERCOCET/ROXICET) 5-325 MG per tablet 1 tablet (1 tablet Oral Given 07/02/16 0233)     Initial Impression / Assessment and Plan / ED Course  I have reviewed the triage vital signs and the nursing notes.  Pertinent labs & imaging results that were available during my care of the patient were reviewed by me and considered in my medical decision making (see chart for details).  Clinical Course    Rodena MedinStephanie C Gelpi is a 45 y.o. female who presents to ED for evaluation of right shoulder pain after mechanical fall prior to arrival. No head injury or loss of consciousness. On exam, right upper extremity is neurovascularly intact. She does have tenderness and mild swelling to the anterior shoulder and proximal humerus. No crepitus or step-offs. X-ray was obtained which shows a minimally displaced fracture through the greater tuberosity of the proximal humerus. Pain controlled while in the ED. Patient placed in shoulder  immobilizer and given orthopedic referral. Short course pain meds given as well. Symptomatic home care instructions and return precautions were discussed at the bedside and all questions were answered.  Patient discussed with Dr. Elesa MassedWard who agrees with treatment plan.   Final Clinical Impressions(s) / ED Diagnoses   Final diagnoses:  Fall, initial encounter  Closed fracture of proximal end of right humerus, unspecified fracture morphology, initial encounter    New Prescriptions Discharge Medication List as of 07/02/2016  3:39 AM    START taking these medications   Details  oxyCODONE-acetaminophen (PERCOCET/ROXICET) 5-325 MG tablet Take 1 tablet by mouth every 8 (eight) hours as needed for severe pain., Starting Fri 07/02/2016, Print         [facility] MaineJaime Pilcher Ward, PA-C 07/02/16 [phone]    Layla MawKristen N Ward, DO 07/02/16  [phone]

## 2016-07-02 NOTE — Discharge Instructions (Signed)
Please call the orthopedic physician listed in the morning to schedule your follow up appointment. Ice for pain relief. Take pain medication only as needed for severe pain - This can make you very drowsy - please do not drink alcohol, operate heavy machinery or drive on this medication. Return to ER if fingertips become cold, new or worsening symptoms, any additional concerns.   COLD THERAPY DIRECTIONS:  Ice or gel packs can be used to reduce both pain and swelling. Ice is the most helpful within the first 24 to 48 hours after an injury or flareup from overusing a muscle or joint.  Ice is effective, has very few side effects, and is safe for most people to use.   If you expose your skin to cold temperatures for too long or without the proper protection, you can damage your skin or nerves. Watch for signs of skin damage due to cold.   HOME CARE INSTRUCTIONS  Follow these tips to use ice and cold packs safely.  Place a dry or damp towel between the ice and skin. A damp towel will cool the skin more quickly, so you may need to shorten the time that the ice is used.  For a more rapid response, add gentle compression to the ice.  Ice for no more than 10 to 20 minutes at a time. The bonier the area you are icing, the less time it will take to get the benefits of ice.  Check your skin after 5 minutes to make sure there are no signs of a poor response to cold or skin damage.  Rest 20 minutes or more in between uses.  Once your skin is numb, you can end your treatment. You can test numbness by very lightly touching your skin. The touch should be so light that you do not see the skin dimple from the pressure of your fingertip. When using ice, most people will feel these normal sensations in this order: cold, burning, aching, and numbness.

## 2016-11-09 ENCOUNTER — Ambulatory Visit
Admission: RE | Admit: 2016-11-09 | Discharge: 2016-11-09 | Disposition: A | Discharge status: Home / Self Care | Payer: 59 | Source: Ambulatory Visit | Attending: Internal Medicine | Admitting: Internal Medicine

## 2016-11-09 ENCOUNTER — Other Ambulatory Visit: Payer: Self-pay | Admitting: Internal Medicine

## 2016-11-09 DIAGNOSIS — R059 Cough, unspecified: Secondary | ICD-10-CM

## 2016-11-09 DIAGNOSIS — R05 Cough: Secondary | ICD-10-CM

## 2016-12-17 ENCOUNTER — Emergency Department (HOSPITAL_COMMUNITY)
Admission: EM | Admit: 2016-12-17 | Discharge: 2016-12-17 | Disposition: A | Discharge status: Home / Self Care | Payer: 59 | Attending: Emergency Medicine | Admitting: Emergency Medicine

## 2016-12-17 ENCOUNTER — Emergency Department (HOSPITAL_COMMUNITY): Payer: 59

## 2016-12-17 ENCOUNTER — Encounter (HOSPITAL_COMMUNITY): Payer: Self-pay | Admitting: Emergency Medicine

## 2016-12-17 DIAGNOSIS — J069 Acute upper respiratory infection, unspecified: Secondary | ICD-10-CM | POA: Insufficient documentation

## 2016-12-17 DIAGNOSIS — Z91040 Latex allergy status: Secondary | ICD-10-CM | POA: Diagnosis not present

## 2016-12-17 DIAGNOSIS — Z79899 Other long term (current) drug therapy: Secondary | ICD-10-CM | POA: Diagnosis not present

## 2016-12-17 DIAGNOSIS — F17210 Nicotine dependence, cigarettes, uncomplicated: Secondary | ICD-10-CM | POA: Insufficient documentation

## 2016-12-17 DIAGNOSIS — R05 Cough: Secondary | ICD-10-CM | POA: Diagnosis present

## 2016-12-17 MED ORDER — BENZONATATE 100 MG PO CAPS: 100.0000 mg | ORAL_CAPSULE | Freq: Three times a day (TID) | ORAL | 0 refills | Status: DC

## 2016-12-17 MED ORDER — FLUTICASONE PROPIONATE 50 MCG/ACT NA SUSP
1.0000 | Freq: Every day | NASAL | 0 refills | Status: DC
Start: 2016-12-17 — End: 2024-01-13

## 2016-12-17 MED ORDER — IPRATROPIUM-ALBUTEROL 0.5-2.5 (3) MG/3ML IN SOLN
3.0000 mL | Freq: Once | RESPIRATORY_TRACT | Status: AC
  Administered 2016-12-17: 3 mL via RESPIRATORY_TRACT
  Filled 2016-12-17: qty 3

## 2016-12-17 NOTE — Discharge Instructions (Signed)
Take Tessalon Perles as needed for cough. You can also take over-the-counter cough syrup if you wanted to instead of the [employer].  Follow-up with her primary care doctor in 24-48 hours.  Return to the emergency department for any worsening conditions, fever, difficulty breathing, chest pain or any other concerning symptoms.

## 2016-12-17 NOTE — ED Provider Notes (Signed)
[facility]-EMERGENCY DEPT Provider Note   CSN: [id number] Arrival date & time: 12/17/16  1656  By signing my name below, I, Becky King, attest that this documentation has been prepared under the direction and in the presence of Maxwell Caul, PA-C . Electronically Signed: Majel King, Scribe. 12/17/2016. 6:09 PM.  History   Chief Complaint Chief Complaint  Patient presents with  . Cough   The history is provided by the patient. No language interpreter was used.   HPI Comments: Becky King is a 46 y.o. female with PMHx of seasonal allergies, who presents to the Emergency Department complaining of gradually worsening, cough that began ~5 days ago. Pt reports associated congestion, chills, and chest tightness secondary to her cough. She states no exacerbating or alleviating factors. She notes she has taken Allegra and Sudafed for her symptoms with no relief. Pt denies any sick contacts, fever, sore throat, nausea, vomiting, abdominal pain, chest pain, shortness of breath, and hx of COPD or asthma. She reports she smokes ~0.5 packs of cigarettes every day.   Past Medical History:  Diagnosis Date  . Seasonal allergies    There are no active problems to display for this patient.  Past Surgical History:  Procedure Laterality Date  . APPENDECTOMY    . HERNIA REPAIR    . TUBAL LIGATION      OB History    No data available     Home Medications    Prior to Admission medications   Medication Sig Start Date End Date Taking? Authorizing Provider  acetaminophen (TYLENOL) 500 MG tablet Take 500 mg by mouth every 6 (six) hours as needed for moderate pain.    Historical Provider, MD  benzonatate (TESSALON) 100 MG capsule Take 1 capsule (100 mg total) by mouth every 8 (eight) hours. 12/17/16   Maxwell Caul, PA-C  fexofenadine-pseudoephedrine (ALLEGRA-D 24) 180-240 MG per 24 hr tablet Take 1 tablet by mouth every morning.     Historical Provider, MD  fluticasone (FLONASE) 50 MCG/ACT  nasal spray Place 1 spray into both nostrils daily. 12/17/16   Maxwell Caul, PA-C  guaiFENesin-dextromethorphan (ROBITUSSIN DM) 100-10 MG/5ML syrup Take 5 mLs by mouth every 4 (four) hours as needed for cough (and congestion). 04/25/16   Trixie Dredge, PA-C  ibuprofen (ADVIL,MOTRIN) 800 MG tablet Take 1 tablet (800 mg total) by mouth every 8 (eight) hours as needed for mild pain or moderate pain. 04/25/16   Trixie Dredge, PA-C  OVER THE COUNTER MEDICATION Apply 1 application topically 2 (two) times daily. Psoriasis lotion    Historical Provider, MD  OVER THE COUNTER MEDICATION Apply 1 application topically daily. Psoriasis scalp solution    Historical Provider, MD  oxyCODONE-acetaminophen (PERCOCET/ROXICET) 5-325 MG tablet Take 1 tablet by mouth every 8 (eight) hours as needed for severe pain. 07/02/16   Chase Picket Ward, PA-C  polyvinyl alcohol (LIQUIFILM TEARS) 1.4 % ophthalmic solution Place 1 drop into both eyes as needed. [employer] Lomb For dry eyes    Historical Provider, MD   Family History No family history on file.  Social History Social History  Substance Use Topics  . Smoking status: Current Every Day Smoker    Packs/day: 0.00    Years: 0.00    Types: Cigarettes  . Smokeless tobacco: Never Used  . Alcohol use No   Allergies   Latex  Review of Systems Review of Systems  Constitutional: Positive for chills. Negative for fever.  HENT: Positive for congestion. Negative for sore throat.  Respiratory: Positive for cough and chest tightness. Negative for shortness of breath.   Cardiovascular: Negative for chest pain.  Gastrointestinal: Negative for abdominal pain, diarrhea, nausea and vomiting.  Neurological: Negative for headaches.  All other systems reviewed and are negative.  Physical Exam Updated Vital Signs BP (!) 143/72 (BP Location: Left Arm)   Pulse 92   Temp 98.5 F (36.9 C) (Oral)   Resp 18   Ht  (1.6 m)   Wt 156 lb (70.8 kg)   LMP 11/09/2016   SpO2 100%    BMI 27.63 kg/m   Physical Exam  Constitutional: She appears well-developed and well-nourished.  HENT:  Head: Normocephalic and atraumatic.  Nose: Mucosal edema and rhinorrhea present. No nasal septal hematoma.  Mouth/Throat: Uvula is midline, oropharynx is clear and moist and mucous membranes are normal.  Eyes: Conjunctivae and EOM are normal. Pupils are equal, round, and reactive to light. Right eye exhibits no discharge. Left eye exhibits no discharge. No scleral icterus.  Pulmonary/Chest: Effort normal. No accessory muscle usage. No respiratory distress. She has wheezes.  Mild, diffuse wheezing to the bilateral upper lung fields. No rales or focal consolidation. No respiratory distress. Able speak in full sentences without any difficulty.  Musculoskeletal: She exhibits no deformity.  Neurological: She is alert.  Skin: Skin is warm and dry.  Psychiatric: She has a normal mood and affect. Her speech is normal and behavior is normal.   ED Treatments / Results  DIAGNOSTIC STUDIES:  Oxygen Saturation is 100% on RA, normal by my interpretation.    COORDINATION OF CARE:  5:20 PM Discussed treatment plan with pt at bedside and pt agreed to plan.  Labs (all labs ordered are listed, but only abnormal results are displayed) Labs Reviewed - No data to display  EKG  EKG Interpretation None       Radiology Dg Chest 2 View  Result Date: 12/17/2016 CLINICAL DATA:  Productive cough for 5 days EXAM: CHEST  2 VIEW COMPARISON:  None. FINDINGS: The heart size and mediastinal contours are within normal limits. Both lungs are clear. The visualized skeletal structures are unremarkable. IMPRESSION: No active cardiopulmonary disease. Electronically Signed   By: Elige Ko   On: 12/17/2016 18:04    Procedures Procedures (including critical care time)  Medications Ordered in ED Medications  ipratropium-albuterol (DUONEB) 0.5-2.5 (3) MG/3ML nebulizer solution 3 mL (3 mLs Nebulization Given  12/17/16 1800)    Initial Impression / Assessment and Plan / ED Course  I have reviewed the triage vital signs and the nursing notes.  Pertinent labs & imaging results that were available during my care of the patient were reviewed by me and considered in my medical decision making (see chart for details).     46 year old female with past medical history of seasonal allergies presents with 4 days of productive cough. Associated with congestion, chills. Has tried over-the-counter medications with no relief of symptoms. No fever reported at home. Patient is afebrile, non-toxic appearing, sitting comfortably on examination table. Physical exam with some diffuse wheezing to the upper lung fields but no rales or rhonchi noted. Consider upper respiratory infection versus pneumonia, the low suspicion of pneumonia given history/physical exam. Wheezing likely a result of patient's smoking history. Though she denies any history of COPD or asthma. Will obtain chest x-ray to rule out any acute infectious etiology. Plan to give patient a DuoNeb treatment to help with wheezing.  Chest x-ray reviewed. Negative for any acute infectious etiology. Discussed results with  patient. She reports feeling slight improvement in symptoms after neb treatment. Plan to send her home with Tessalon Perles and fluticasone for symptomatic relief. Given duration of symptoms and well-appearing physical exam no antibiotics indicated at this time. Instructed patient to follow-up with PCP in 2 days. Return precautions discussed. Patient expresses understanding and agreement to plan.    Final Clinical Impressions(s) / ED Diagnoses   Final diagnoses:  Acute upper respiratory infection    New Prescriptions New Prescriptions   BENZONATATE (TESSALON) 100 MG CAPSULE    Take 1 capsule (100 mg total) by mouth every 8 (eight) hours.   FLUTICASONE (FLONASE) 50 MCG/ACT NASAL SPRAY    Place 1 spray into both nostrils daily.   I personally  performed the services described in this documentation, which was scribed in my presence. The recorded information has been reviewed and is accurate.    Maxwell Caul, PA-C 12/17/16 1845    Lavera Guise, MD 12/18/16 [phone]

## 2016-12-17 NOTE — ED Triage Notes (Signed)
Pt verbalizes worsening productive cough onset 4-5 days ago.

## 2016-12-17 NOTE — ED Notes (Signed)
PT DISCHARGED. INSTRUCTIONS AND PRESCRIPTIONS GIVEN. AAOX4. PT IN NO APPARENT DISTRESS OR PAIN. THE OPPORTUNITY TO ASK QUESTIONS WAS PROVIDED. 

## 2017-06-04 ENCOUNTER — Encounter (HOSPITAL_COMMUNITY): Payer: Self-pay

## 2017-06-04 ENCOUNTER — Emergency Department (HOSPITAL_COMMUNITY)
Admission: EM | Admit: 2017-06-04 | Discharge: 2017-06-04 | Discharge status: Against Medical Advice | Payer: 59 | Attending: Emergency Medicine | Admitting: Emergency Medicine

## 2017-06-04 DIAGNOSIS — R1031 Right lower quadrant pain: Secondary | ICD-10-CM | POA: Diagnosis present

## 2017-06-04 DIAGNOSIS — Z5321 Procedure and treatment not carried out due to patient leaving prior to being seen by health care provider: Secondary | ICD-10-CM | POA: Insufficient documentation

## 2017-06-04 HISTORY — DX: Calculus of kidney: N20.0

## 2017-06-04 LAB — URINALYSIS, ROUTINE W REFLEX MICROSCOPIC
BACTERIA UA: NONE SEEN
Bilirubin Urine: NEGATIVE
GLUCOSE, UA: NEGATIVE mg/dL
Ketones, ur: NEGATIVE mg/dL
NITRITE: NEGATIVE
Protein, ur: NEGATIVE mg/dL
SPECIFIC GRAVITY, URINE: 1.018 (ref 1.005–1.030)
pH: 6 (ref 5.0–8.0)

## 2017-06-04 NOTE — ED Notes (Signed)
No answer when called for blood draw in lobby or women's bathroom.

## 2017-06-04 NOTE — ED Notes (Signed)
Pt called from the lobby with no response x2 

## 2017-06-04 NOTE — ED Triage Notes (Signed)
Pt reports right sided flank pain 10/10 that started last night. Pt reports increased urinary frequency. Pt denies fevers at home. Pt reports hx of kidney stones.

## 2017-06-04 NOTE — ED Notes (Signed)
Writer called for blood draws, no response 

## 2017-06-05 ENCOUNTER — Emergency Department (HOSPITAL_COMMUNITY)
Admission: EM | Admit: 2017-06-05 | Discharge: 2017-06-05 | Disposition: A | Discharge status: Home / Self Care | Payer: 59 | Attending: Emergency Medicine | Admitting: Emergency Medicine

## 2017-06-05 ENCOUNTER — Encounter (HOSPITAL_COMMUNITY): Payer: Self-pay | Admitting: Emergency Medicine

## 2017-06-05 DIAGNOSIS — Z79899 Other long term (current) drug therapy: Secondary | ICD-10-CM | POA: Insufficient documentation

## 2017-06-05 DIAGNOSIS — R0981 Nasal congestion: Secondary | ICD-10-CM | POA: Diagnosis not present

## 2017-06-05 DIAGNOSIS — Z91040 Latex allergy status: Secondary | ICD-10-CM | POA: Diagnosis not present

## 2017-06-05 DIAGNOSIS — R109 Unspecified abdominal pain: Secondary | ICD-10-CM

## 2017-06-05 DIAGNOSIS — F17210 Nicotine dependence, cigarettes, uncomplicated: Secondary | ICD-10-CM | POA: Diagnosis not present

## 2017-06-05 DIAGNOSIS — R300 Dysuria: Secondary | ICD-10-CM | POA: Diagnosis not present

## 2017-06-05 LAB — URINALYSIS, ROUTINE W REFLEX MICROSCOPIC
Bilirubin Urine: NEGATIVE
GLUCOSE, UA: NEGATIVE mg/dL
Hgb urine dipstick: NEGATIVE
Ketones, ur: NEGATIVE mg/dL
LEUKOCYTES UA: NEGATIVE
Nitrite: NEGATIVE
PH: 5 (ref 5.0–8.0)
Protein, ur: NEGATIVE mg/dL
Specific Gravity, Urine: 1.017 (ref 1.005–1.030)

## 2017-06-05 LAB — CBC WITH DIFFERENTIAL/PLATELET
BASOS ABS: 0 10*3/uL (ref 0.0–0.1)
Basophils Relative: 0 %
Eosinophils Absolute: 0.1 10*3/uL (ref 0.0–0.7)
Eosinophils Relative: 1 %
HCT: 37.2 % (ref 36.0–46.0)
Hemoglobin: 11.3 g/dL — ABNORMAL LOW (ref 12.0–15.0)
LYMPHS PCT: 29 %
Lymphs Abs: 2.4 10*3/uL (ref 0.7–4.0)
MCH: 24.4 pg — ABNORMAL LOW (ref 26.0–34.0)
MCHC: 30.4 g/dL (ref 30.0–36.0)
MCV: 80.3 fL (ref 78.0–100.0)
MONO ABS: 0.6 10*3/uL (ref 0.1–1.0)
MONOS PCT: 7 %
NEUTROS ABS: 5.2 10*3/uL (ref 1.7–7.7)
Neutrophils Relative %: 63 %
Platelets: 412 10*3/uL — ABNORMAL HIGH (ref 150–400)
RBC: 4.63 MIL/uL (ref 3.87–5.11)
RDW: 15.6 % — AB (ref 11.5–15.5)
WBC: 8.2 10*3/uL (ref 4.0–10.5)

## 2017-06-05 LAB — COMPREHENSIVE METABOLIC PANEL
ALT: 13 U/L — ABNORMAL LOW (ref 14–54)
ANION GAP: 5 (ref 5–15)
AST: 16 U/L (ref 15–41)
Albumin: 3.6 g/dL (ref 3.5–5.0)
Alkaline Phosphatase: 48 U/L (ref 38–126)
BILIRUBIN TOTAL: 0.7 mg/dL (ref 0.3–1.2)
BUN: 10 mg/dL (ref 6–20)
CHLORIDE: 106 mmol/L (ref 101–111)
CO2: 25 mmol/L (ref 22–32)
Calcium: 8.7 mg/dL — ABNORMAL LOW (ref 8.9–10.3)
Creatinine, Ser: 0.44 mg/dL (ref 0.44–1.00)
GFR calc Af Amer: >60 mL/min (ref >60)
GFR calc non Af Amer: >60 mL/min (ref >60)
GLUCOSE: 98 mg/dL (ref 65–99)
POTASSIUM: 3.3 mmol/L — AB (ref 3.5–5.1)
SODIUM: 136 mmol/L (ref 135–145)
TOTAL PROTEIN: 6.8 g/dL (ref 6.5–8.1)

## 2017-06-05 LAB — I-STAT BETA HCG BLOOD, ED (MC, WL, AP ONLY): I-stat hCG, quantitative: <5 m[IU]/mL (ref <5)

## 2017-06-05 NOTE — ED Provider Notes (Signed)
[facility]-EMERGENCY DEPT Provider Note   CSN: [id number] Arrival date & time: 06/05/17  4098     History   Chief Complaint Chief Complaint  Patient presents with  . Nasal Congestion  . Abdominal Pain    right side     HPI Becky King is a 46 y.o. female.  Patient with history of appendectomy, tubal ligation, regular menstruation -- presents with acute onset, constant, sharp, right lower quadrant abdominal pain for approximately 1 day. Pain radiates around her right flank. Patient went to [facility] emergency department last night but due to a long wait time, she decided to go home. Patient reports frequent urination with mild dysuria. Pain is intermittent, worse with palpation and certain positions. She denies any fever, chest pain, shortness of breath, diarrhea, constipation, blood in the stool. She denies any current vaginal bleeding or discharge. She has been taking her allergy medications but no OTC medications for pain. .  Patient reports having seasonal allergies and nasal congestion due to this. This is not her primary complaint today -- she states she is here because of the abdominal pain. She has a nonproductive cough as well. No shortness of breath or hemoptysis. No lower extremity swelling.      Past Medical History:  Diagnosis Date  . Kidney stones   . Seasonal allergies     There are no active problems to display for this patient.   Past Surgical History:  Procedure Laterality Date  . APPENDECTOMY    . HERNIA REPAIR    . TUBAL LIGATION      OB History    No data available       Home Medications    Prior to Admission medications   Medication Sig Start Date End Date Taking? Authorizing Provider  acetaminophen (TYLENOL) 500 MG tablet Take 500 mg by mouth every 6 (six) hours as needed for moderate pain.    [provider]  benzonatate (TESSALON) 100 MG capsule Take 1 capsule (100 mg total) by mouth every 8 (eight) hours. 12/17/16   Maxwell Caul, PA-C  fexofenadine-pseudoephedrine (ALLEGRA-D 24) 180-240 MG per 24 hr tablet Take 1 tablet by mouth every morning.     [provider]  fluticasone (FLONASE) 50 MCG/ACT nasal spray Place 1 spray into both nostrils daily. 12/17/16   Maxwell Caul, PA-C  guaiFENesin-dextromethorphan (ROBITUSSIN DM) 100-10 MG/5ML syrup Take 5 mLs by mouth every 4 (four) hours as needed for cough (and congestion). 04/25/16   Trixie Dredge, PA-C  ibuprofen (ADVIL,MOTRIN) 800 MG tablet Take 1 tablet (800 mg total) by mouth every 8 (eight) hours as needed for mild pain or moderate pain. 04/25/16   Trixie Dredge, PA-C  OVER THE COUNTER MEDICATION Apply 1 application topically 2 (two) times daily. Psoriasis lotion    [provider]  OVER THE COUNTER MEDICATION Apply 1 application topically daily. Psoriasis scalp solution    [provider]  oxyCODONE-acetaminophen (PERCOCET/ROXICET) 5-325 MG tablet Take 1 tablet by mouth every 8 (eight) hours as needed for severe pain. 07/02/16   Ward, Chase Picket, PA-C  polyvinyl alcohol (LIQUIFILM TEARS) 1.4 % ophthalmic solution Place 1 drop into both eyes as needed. [employer] Lomb For dry eyes    [provider]    Family History No family history on file.  Social History Social History  Substance Use Topics  . Smoking status: Current Every Day Smoker    Packs/day: 0.00    Years: 0.00    Types:  Cigarettes  . Smokeless tobacco: Never Used  . Alcohol use No     Allergies   Latex   Review of Systems Review of Systems  Constitutional: Negative for fever.  HENT: Positive for congestion. Negative for rhinorrhea and sore throat.   Eyes: Negative for redness.  Respiratory: Positive for cough.   Cardiovascular: Negative for chest pain.  Gastrointestinal: Positive for abdominal pain. Negative for diarrhea, nausea and vomiting.  Genitourinary: Positive for dysuria, flank pain and frequency. Negative for menstrual problem.    Musculoskeletal: Negative for myalgias.  Skin: Negative for rash.  Neurological: Negative for headaches.     Physical Exam Updated Vital Signs BP (!) 99/40 (BP Location: Left Arm)   Pulse 70   Temp 98 F (36.7 C) (Oral)   Resp 18   Ht  (1.6 m)   Wt 72.6 kg (160 lb)   LMP 04/05/2017   SpO2 98%   BMI 28.34 kg/m   Physical Exam  Constitutional: She appears well-developed and well-nourished.  HENT:  Head: Normocephalic and atraumatic.  Nose: Mucosal edema present.  Eyes: Conjunctivae are normal. Right eye exhibits no discharge. Left eye exhibits no discharge.  Neck: Normal range of motion. Neck supple.  Cardiovascular: Normal rate, regular rhythm and normal heart sounds.   Pulmonary/Chest: Effort normal and breath sounds normal. No respiratory distress. She has no wheezes. She has no rales.  Abdominal: Soft. There is tenderness. There is no rebound and no guarding.  Mild tenderness to palpation in the right lower quadrant and more so on the right inferior flank. Worse with twisting motion.   Neurological: She is alert.  Skin: Skin is warm and dry.  Psychiatric: She has a normal mood and affect.  Nursing note and vitals reviewed.    ED Treatments / Results  Labs (all labs ordered are listed, but only abnormal results are displayed) Labs Reviewed  CBC WITH DIFFERENTIAL/PLATELET - Abnormal; Notable for the following:       Result Value   Hemoglobin 11.3 (*)    MCH 24.4 (*)    RDW 15.6 (*)    Platelets 412 (*)    All other components within normal limits  COMPREHENSIVE METABOLIC PANEL - Abnormal; Notable for the following:    Potassium 3.3 (*)    Calcium 8.7 (*)    ALT 13 (*)    All other components within normal limits  URINALYSIS, ROUTINE W REFLEX MICROSCOPIC  I-STAT BETA HCG BLOOD, ED ([facility], [facility], [facility] ONLY)    Procedures Procedures (including critical care time)  Medications Ordered in ED Medications - No data to display   Initial Impression / Assessment  and Plan / ED Course  I have reviewed the triage vital signs and the nursing notes.  Pertinent labs & imaging results that were available during my care of the patient were reviewed by me and considered in my medical decision making (see chart for details).     Patient seen and examined. Reviewed UA from [facility] long yesterday which shows hemoglobin and leukocyte esterase, but microscopic with no appreciable white and red blood cells, and no bacteria. Will recollect and check labs, ensure no pregnancy  Vital signs reviewed and are as follows: BP (!) 99/40 (BP Location: Left Arm)   Pulse 70   Temp 98 F (36.7 C) (Oral)   Resp 18   Ht  (1.6 m)   Wt 72.6 kg (160 lb)   LMP 04/05/2017   SpO2 98%   BMI 28.34 kg/m  3:08 PM Patient well here. Exam unchanged.   We discussed results -- normal urine today, reassuring labs.   Encouraged PCP f/u in 48 hrs for recheck. The patient was urged to return to the Emergency Department immediately with worsening of current symptoms, worsening abdominal pain, persistent vomiting, blood noted in stools, fever, or any other concerns. The patient verbalized understanding. Encouraged no heavy lifting/pushing/pulling, twisting.    Final Clinical Impressions(s) / ED Diagnoses   Final diagnoses:  Flank pain   Patient with abdominal/flank pain. Vitals are stable, no fever. Labs reassuring, normal WBC, normal kidney and liver fcn, UA clear without infection, no blood. Imaging not felt indicated at this time. No signs of dehydration, patient is tolerating PO's. Lungs are clear and no signs suggestive of PNA. H/o appendectomy and do not suspect appendicitis. Pregnancy not crossing over but I confirmed this was negative. Low concern for  cholecystitis, pancreatitis, ruptured viscus, UTI, kidney stone, aortic dissection, aortic aneurysm or other emergent abdominal etiology. Ovarian cyst possible. Also consider abdominal wall tenderness given that it is positional  and tender to palpation. Pain is mild, no vomiting and I doubt torsion based on history and exam. Supportive therapy indicated with return if symptoms worsen. Home with tylenol/ibuprofen, heat for symptoms treatment. Return instructions as above.   Seasonal allergies: Continue home OTC treatment   New Prescriptions New Prescriptions   No medications on file     Renne Crigler, Cordelia Poche 06/05/17 1517    Little, Ambrose Finland, MD 06/06/17 [phone]

## 2017-06-05 NOTE — ED Triage Notes (Signed)
Pt. Stated, Im having some congestion and my rt side into my back has been hurting for 2 days.

## 2017-06-05 NOTE — Discharge Instructions (Signed)
Please read and follow all provided instructions.  Your diagnoses today include:  1. Flank pain     Tests performed today include:  Blood counts and electrolytes  Blood tests to check liver and kidney function  Urine test to look for infection  Vital signs. See below for your results today.   Medications prescribed:   None  Take any prescribed medications only as directed.  Home care instructions:   Follow any educational materials contained in this packet.  Follow-up instructions: Please follow-up with your primary care provider in the next 2 days for further evaluation of your symptoms.    Return instructions:  SEEK IMMEDIATE MEDICAL ATTENTION IF:  The pain does not go away or becomes severe   A temperature above 101F develops   Repeated vomiting occurs (multiple episodes)   The pain becomes localized to portions of the abdomen. The right side could possibly be appendicitis. In an adult, the left lower portion of the abdomen could be colitis or diverticulitis.   Blood is being passed in stools or vomit (bright red or black tarry stools)   You develop chest pain, difficulty breathing, dizziness or fainting, or become confused, poorly responsive, or inconsolable (young children)  If you have any other emergent concerns regarding your health  Additional Information: Abdominal (belly) pain can be caused by many things. Your caregiver performed an examination and possibly ordered blood/urine tests and imaging (CT scan, x-rays, ultrasound). Many cases can be observed and treated at home after initial evaluation in the emergency department. Even though you are being discharged home, abdominal pain can be unpredictable. Therefore, you need a repeated exam if your pain does not resolve, returns, or worsens. Most patients with abdominal pain don't have to be admitted to the hospital or have surgery, but serious problems like appendicitis and gallbladder attacks can start out as  nonspecific pain. Many abdominal conditions cannot be diagnosed in one visit, so follow-up evaluations are very important.  Your vital signs today were: BP (!) 99/40 (BP Location: Left Arm)    Pulse 70    Temp 98 F (36.7 C) (Oral)    Resp 18    Ht  (1.6 m)    Wt 72.6 kg (160 lb)    LMP 04/05/2017    SpO2 98%    BMI 28.34 kg/m  If your blood pressure (bp) was elevated above 135/85 this visit, please have this repeated by your doctor within one month. --------------

## 2017-12-10 ENCOUNTER — Emergency Department (HOSPITAL_COMMUNITY): Payer: 59

## 2017-12-10 ENCOUNTER — Encounter (HOSPITAL_COMMUNITY): Payer: Self-pay

## 2017-12-10 ENCOUNTER — Emergency Department (HOSPITAL_COMMUNITY)
Admission: EM | Admit: 2017-12-10 | Discharge: 2017-12-10 | Disposition: A | Discharge status: Home / Self Care | Payer: 59 | Attending: Physician Assistant | Admitting: Physician Assistant

## 2017-12-10 DIAGNOSIS — S92501A Displaced unspecified fracture of right lesser toe(s), initial encounter for closed fracture: Secondary | ICD-10-CM

## 2017-12-10 DIAGNOSIS — W228XXA Striking against or struck by other objects, initial encounter: Secondary | ICD-10-CM | POA: Diagnosis not present

## 2017-12-10 DIAGNOSIS — S92514A Nondisplaced fracture of proximal phalanx of right lesser toe(s), initial encounter for closed fracture: Secondary | ICD-10-CM | POA: Insufficient documentation

## 2017-12-10 DIAGNOSIS — F17210 Nicotine dependence, cigarettes, uncomplicated: Secondary | ICD-10-CM | POA: Diagnosis not present

## 2017-12-10 DIAGNOSIS — Y999 Unspecified external cause status: Secondary | ICD-10-CM | POA: Insufficient documentation

## 2017-12-10 DIAGNOSIS — Y929 Unspecified place or not applicable: Secondary | ICD-10-CM | POA: Insufficient documentation

## 2017-12-10 DIAGNOSIS — S99921A Unspecified injury of right foot, initial encounter: Secondary | ICD-10-CM | POA: Diagnosis present

## 2017-12-10 DIAGNOSIS — Y939 Activity, unspecified: Secondary | ICD-10-CM | POA: Diagnosis not present

## 2017-12-10 MED ORDER — DICLOFENAC SODIUM 50 MG PO TBEC: 50.0000 mg | DELAYED_RELEASE_TABLET | Freq: Two times a day (BID) | ORAL | 0 refills | Status: DC

## 2017-12-10 NOTE — ED Triage Notes (Signed)
Pt states that a week ago she hit her foot on a chair and has bruising and swelling to the third through fifth digits. Pain mostly in pinky toe.

## 2017-12-10 NOTE — Discharge Instructions (Addendum)
Follow-up with your primary care doctor.  Return here as needed °

## 2017-12-10 NOTE — ED Provider Notes (Signed)
[facility]   CSN: [id number] Arrival date & time: 12/10/17  2055     History   Chief Complaint Chief Complaint  Patient presents with  . Foot Pain    HPI Becky King is a 47 y.o. female who presents to the ED with foot pain. Patient reports that a week ago she hit her foot on a chair and has bruising and swelling to the toes. The most pain is in the right 5th toe.  HPI  Past Medical History:  Diagnosis Date  . Kidney stones   . Seasonal allergies     There are no active problems to display for this patient.   Past Surgical History:  Procedure Laterality Date  . APPENDECTOMY    . HERNIA REPAIR    . TUBAL LIGATION       OB History   None      Home Medications    Prior to Admission medications   Medication Sig Start Date End Date Taking? Authorizing Provider  acetaminophen (TYLENOL) 500 MG tablet Take 500 mg by mouth every 6 (six) hours as needed for moderate pain.    [provider]  diclofenac (VOLTAREN) 50 MG EC tablet Take 1 tablet (50 mg total) by mouth 2 (two) times daily. 12/10/17   Janne NapoleonNeese, Josiel Gahm M, NP  fexofenadine-pseudoephedrine (ALLEGRA-D 24) 180-240 MG per 24 hr tablet Take 1 tablet by mouth every morning.     [provider]  fluticasone (FLONASE) 50 MCG/ACT nasal spray Place 1 spray into both nostrils daily. Patient taking differently: Place 1 spray into both nostrils daily as needed for allergies.  12/17/16   Maxwell CaulLayden, Lindsey A, PA-C  guaiFENesin-dextromethorphan (ROBITUSSIN DM) 100-10 MG/5ML syrup Take 5 mLs by mouth every 4 (four) hours as needed for cough (and congestion). 04/25/16   Trixie DredgeWest, Emily, PA-C  OVER THE COUNTER MEDICATION Apply 1 application topically 2 (two) times daily. Psoriasis lotion    [provider]  OVER THE COUNTER MEDICATION Apply 1 application topically daily. Psoriasis scalp solution    [provider]  polyvinyl alcohol (LIQUIFILM  TEARS) 1.4 % ophthalmic solution Place 1 drop into both eyes as needed. [employer] For dry eyes    [provider]    Family History No family history on file.  Social History Social History   Tobacco Use  . Smoking status: Current Every Day Smoker    Packs/day: 0.00    Years: 0.00    Pack years: 0.00    Types: Cigarettes  . Smokeless tobacco: Never Used  Substance Use Topics  . Alcohol use: No  . Drug use: No     Allergies   Latex   Review of Systems Review of Systems  Musculoskeletal: Positive for arthralgias.       Right foot  All other systems reviewed and are negative.    Physical Exam Updated Vital Signs BP (!) 132/55 (BP Location: Right Arm)   Pulse 96   Temp 98.2 F (36.8 C)   Resp 15   Ht 5\' 3"  (1.6 m)   Wt 77.1 kg (170 lb)   LMP 10/22/2017   SpO2 99%   BMI 30.11 kg/m   Physical Exam  Constitutional: She appears well-developed and well-nourished. No distress.  HENT:  Head: Normocephalic.  Eyes: EOM are normal.  Neck: Neck supple.  Cardiovascular: Normal rate.  Pulmonary/Chest: Effort normal.  Musculoskeletal:       Feet:  Pedal pulse @+,  adequate circulation, ecchymosis to the 4th and 5th toes. Fifth toe tender with palpation and range of motion.   Neurological: She is alert.  Skin: Skin is warm and dry.  Psychiatric: She has a normal mood and affect. Her behavior is normal.  Nursing note and vitals reviewed.    ED Treatments / Results  Labs (all labs ordered are listed, but only abnormal results are displayed) Labs Reviewed - No data to display Radiology Dg Toe 5th Right  Result Date: 12/10/2017 CLINICAL DATA:  Hit toe on chair 1 week ago.  Pain. EXAM: RIGHT FIFTH TOE COMPARISON:  None. FINDINGS: There is a nondisplaced fracture through the proximal shaft of the right 5th toe proximal phalanx. No intra-articular extension. No subluxation or dislocation. IMPRESSION: Nondisplaced fracture through the proximal shaft of the  right 5th toe proximal phalanx. Electronically Signed   By: Charlett Nose M.D.   On: 12/10/2017 21:44    Procedures Procedures (including critical care time)  Medications Ordered in ED Medications - No data to display   Initial Impression / Assessment and Plan / ED Course  I have reviewed the triage vital signs and the nursing notes. 47 y.o. female with pain and ecchymosis to the right toes with fracture of the 5th toe noted on x-ray. Buddy tape, post op shoe and NSAIDS. Patient stable for d/c without focal neuro deficits. F/u with PCP.   Final Clinical Impressions(s) / ED Diagnoses   Final diagnoses:  Fracture of fifth toe, right, closed, initial encounter    ED Discharge Orders        Ordered    diclofenac (VOLTAREN) 50 MG EC tablet  2 times daily     12/10/17 2156       Kerrie Buffalo [place], NP 12/10/17 2213    Abelino Derrick, MD 12/10/17 2348

## 2018-03-14 ENCOUNTER — Ambulatory Visit (INDEPENDENT_AMBULATORY_CARE_PROVIDER_SITE_OTHER): Payer: 59 | Admitting: Women's Health

## 2018-03-14 ENCOUNTER — Other Ambulatory Visit: Payer: Self-pay | Admitting: Women's Health

## 2018-03-14 ENCOUNTER — Encounter: Payer: Self-pay | Admitting: Women's Health

## 2018-03-14 VITALS — BP 118/80 | Ht 63.0 in | Wt 184.0 lb

## 2018-03-14 DIAGNOSIS — R635 Abnormal weight gain: Secondary | ICD-10-CM | POA: Diagnosis not present

## 2018-03-14 DIAGNOSIS — N912 Amenorrhea, unspecified: Secondary | ICD-10-CM

## 2018-03-14 DIAGNOSIS — Z01419 Encounter for gynecological examination (general) (routine) without abnormal findings: Secondary | ICD-10-CM

## 2018-03-14 DIAGNOSIS — Z13220 Encounter for screening for lipoid disorders: Secondary | ICD-10-CM | POA: Diagnosis not present

## 2018-03-14 DIAGNOSIS — L409 Psoriasis, unspecified: Secondary | ICD-10-CM

## 2018-03-14 DIAGNOSIS — Z1231 Encounter for screening mammogram for malignant neoplasm of breast: Secondary | ICD-10-CM

## 2018-03-14 NOTE — Progress Notes (Signed)
Rodena MedinStephanie C Brillhart 01/03/1971 [id number]    History:    Presents for annual exam.  21 to 28-day cycles, increased cycle frequency over the past year and has now been amenorrheic for over 2 months with multiple hot flushes and weight gain.  BTL.  Same partner.  Last annual exam here greater than 10 years ago prior to her last pregnancy.  Normal Pap history.  Has not had a mammogram.  Past medical history, past surgical history, family history and social history were all reviewed and documented in the EPIC chart.  Works at [employer].  Children are 9, 27, 29, all doing well.  Mother heart disease.  ROS:  A ROS was performed and pertinent positives and negatives are included.  Exam:  Vitals:   03/14/18 1122  BP: 118/80  Weight: 184 lb (83.5 kg)  Height: 5\' 3"  (1.6 m)   Body mass index is 32.59 kg/m.   General appearance:  Normal Thyroid:  Symmetrical, normal in size, without palpable masses or nodularity. Respiratory  Auscultation:  Clear without wheezing or rhonchi Cardiovascular  Auscultation:  Regular rate, without rubs, murmurs or gallops  Edema/varicosities:  Not grossly evident Abdominal  Soft,nontender, without masses, guarding or rebound.  Liver/spleen:  No organomegaly noted  Hernia:  None appreciated  Skin  Inspection: Patches of psoriasis legs, trunk, scalp  Breasts: Examined lying and sitting.     Right: Without masses, retractions, discharge or axillary adenopathy.     Left: Without masses, retractions, discharge or axillary adenopathy. Gentitourinary   Inguinal/mons:  Normal without inguinal adenopathy  External genitalia:  Normal  BUS/Urethra/Skene's glands:  Normal  Vagina:  Normal  Cervix:  Normal  Uterus:  normal in size, shape and contour.  Midline and mobile  Adnexa/parametria:     Rt: Without masses or tenderness.   Lt: Without masses or tenderness.  Anus and perineum: Normal  Digital rectal exam: Normal sphincter tone without palpated masses or  tenderness  Assessment/Plan:  47 y.o. MWF G3, P3 for annual exam with complaint of weight gain of 20 pounds over the past few months with no change in routine.  Perimenopausal/BTL Psoriasis-dermatologist Obesity Smoker 2 to 3 cigarettes daily  Plan: Menopause reviewed, SBE's, reviewed importance of annual screening mammogram, breast center information given instructed to schedule.  Exercise, calcium rich foods, vitamin D 1000 daily encouraged.  Decrease calorie/carbs for weight loss encouraged.  Down to 2 to 3 cigarettes daily, aware of need to completely quit.  CBC, CMP, lipid panel, TSH, FSH, Pap with HR HPV typing, new screening guidelines reviewed.   Harrington Challengerancy J Alphonso Gregson [facility], 11:51 AM 03/14/2018

## 2018-03-14 NOTE — Addendum Note (Signed)
Addended by: Tito DineBONHAM, KIM A on: 03/14/2018 12:31 PM   Modules accepted: Orders

## 2018-03-14 NOTE — Patient Instructions (Addendum)
[employer] [place]  Health Maintenance, Female Adopting a healthy lifestyle and getting preventive care can go a long way to promote health and wellness. Talk with your health care provider about what schedule of regular examinations is right for you. This is a good chance for you to check in with your provider about disease prevention and staying healthy. In between checkups, there are plenty of things you can do on your own. Experts have done a lot of research about which lifestyle changes and preventive measures are most likely to keep you healthy. Ask your health care provider for more information. Weight and diet Eat a healthy diet  Be sure to include plenty of vegetables, fruits, low-fat dairy products, and lean protein.  Do not eat a lot of foods high in solid fats, added sugars, or salt.  Get regular exercise. This is one of the most important things you can do for your health. ? Most adults should exercise for at least 150 minutes each week. The exercise should increase your heart rate and make you sweat (moderate-intensity exercise). ? Most adults should also do strengthening exercises at least twice a week. This is in addition to the moderate-intensity exercise.  Maintain a healthy weight  Body mass index (BMI) is a measurement that can be used to identify possible weight problems. It estimates body fat based on height and weight. Your health care provider can help determine your BMI and help you achieve or maintain a healthy weight.  For females 66 years of age and older: ? A BMI below 18.5 is considered underweight. ? A BMI of 18.5 to 24.9 is normal. ? A BMI of 25 to 29.9 is considered overweight. ? A BMI of 30 and above is considered obese.  Watch levels of cholesterol and blood lipids  You should start having your blood tested for lipids and cholesterol at 49 years of age, then have this test every 5 years.  You may need to have your cholesterol levels  checked more often if: ? Your lipid or cholesterol levels are high. ? You are older than 49 years of age. ? You are at high risk for heart disease.  Cancer screening Lung Cancer  Lung cancer screening is recommended for adults 40-49 years old who are at high risk for lung cancer because of a history of smoking.  A yearly low-dose CT scan of the lungs is recommended for people who: ? Currently smoke. ? Have quit within the past 15 years. ? Have at least a 30-pack-year history of smoking. A pack year is smoking an average of one pack of cigarettes a day for 1 year.  Yearly screening should continue until it has been 15 years since you quit.  Yearly screening should stop if you develop a health problem that would prevent you from having lung cancer treatment.  Breast Cancer  Practice breast self-awareness. This means understanding how your breasts normally appear and feel.  It also means doing regular breast self-exams. Let your health care provider know about any changes, no matter how small.  If you are in your 20s or 30s, you should have a clinical breast exam (CBE) by a health care provider every 1-3 years as part of a regular health exam.  If you are 49 or older, have a CBE every year. Also consider having a breast X-ray (mammogram) every year.  If you have a family history of breast cancer, talk to your health care provider about genetic screening.  If you are at high risk for breast cancer, talk to your health care provider about having an MRI and a mammogram every year.  Breast cancer gene (BRCA) assessment is recommended for women who have family members with BRCA-related cancers. BRCA-related cancers include: ? Breast. ? Ovarian. ? Tubal. ? Peritoneal cancers.  Results of the assessment will determine the need for genetic counseling and BRCA1 and BRCA2 testing.  Cervical Cancer Your health care provider may recommend that you be screened regularly for cancer of the  pelvic organs (ovaries, uterus, and vagina). This screening involves a pelvic examination, including checking for microscopic changes to the surface of your cervix (Pap test). You may be encouraged to have this screening done every 3 years, beginning at age 49.  For women ages 82-65, health care providers may recommend pelvic exams and Pap testing every 3 years, or they may recommend the Pap and pelvic exam, combined with testing for human papilloma virus (HPV), every 5 years. Some types of HPV increase your risk of cervical cancer. Testing for HPV may also be done on women of any age with unclear Pap test results.  Other health care providers may not recommend any screening for nonpregnant women who are considered low risk for pelvic cancer and who do not have symptoms. Ask your health care provider if a screening pelvic exam is right for you.  If you have had past treatment for cervical cancer or a condition that could lead to cancer, you need Pap tests and screening for cancer for at least 20 years after your treatment. If Pap tests have been discontinued, your risk factors (such as having a new sexual partner) need to be reassessed to determine if screening should resume. Some women have medical problems that increase the chance of getting cervical cancer. In these cases, your health care provider may recommend more frequent screening and Pap tests.  Colorectal Cancer  This type of cancer can be detected and often prevented.  Routine colorectal cancer screening usually begins at 49 years of age and continues through 49 years of age.  Your health care provider may recommend screening at an earlier age if you have risk factors for colon cancer.  Your health care provider may also recommend using home test kits to check for hidden blood in the stool.  A small camera at the end of a tube can be used to examine your colon directly (sigmoidoscopy or colonoscopy). This is done to check for the  earliest forms of colorectal cancer.  Routine screening usually begins at age 15.  Direct examination of the colon should be repeated every 5-10 years through 49 years of age. However, you may need to be screened more often if early forms of precancerous polyps or small growths are found.  Skin Cancer  Check your skin from head to toe regularly.  Tell your health care provider about any new moles or changes in moles, especially if there is a change in a mole's shape or color.  Also tell your health care provider if you have a mole that is larger than the size of a pencil eraser.  Always use sunscreen. Apply sunscreen liberally and repeatedly throughout the day.  Protect yourself by wearing long sleeves, pants, a wide-brimmed hat, and sunglasses whenever you are outside.  Heart disease, diabetes, and high blood pressure  High blood pressure causes heart disease and increases the risk of stroke. High blood pressure is more likely to develop in: ? People who have blood  pressure in the high end of the normal range (130-139/85-89 mm Hg). ? People who are overweight or obese. ? People who are African American.  If you are 56-87 years of age, have your blood pressure checked every 3-5 years. If you are 74 years of age or older, have your blood pressure checked every year. You should have your blood pressure measured twice-once when you are at a hospital or clinic, and once when you are not at a hospital or clinic. Record the average of the two measurements. To check your blood pressure when you are not at a hospital or clinic, you can use: ? An automated blood pressure machine at a pharmacy. ? A home blood pressure monitor.  If you are between 69 years and 79 years old, ask your health care provider if you should take aspirin to prevent strokes.  Have regular diabetes screenings. This involves taking a blood sample to check your fasting blood sugar level. ? If you are at a normal weight and  have a low risk for diabetes, have this test once every three years after 49 years of age. ? If you are overweight and have a high risk for diabetes, consider being tested at a younger age or more often. Preventing infection Hepatitis B  If you have a higher risk for hepatitis B, you should be screened for this virus. You are considered at high risk for hepatitis B if: ? You were born in a country where hepatitis B is common. Ask your health care provider which countries are considered high risk. ? Your parents were born in a high-risk country, and you have not been immunized against hepatitis B (hepatitis B vaccine). ? You have HIV or AIDS. ? You use needles to inject street drugs. ? You live with someone who has hepatitis B. ? You have had sex with someone who has hepatitis B. ? You get hemodialysis treatment. ? You take certain medicines for conditions, including cancer, organ transplantation, and autoimmune conditions.  Hepatitis C  Blood testing is recommended for: ? Everyone born from [birth year] through 1967. ? Anyone with known risk factors for hepatitis C.  Sexually transmitted infections (STIs)  You should be screened for sexually transmitted infections (STIs) including gonorrhea and chlamydia if: ? You are sexually active and are younger than 49 years of age. ? You are older than 49 years of age and your health care provider tells you that you are at risk for this type of infection. ? Your sexual activity has changed since you were last screened and you are at an increased risk for chlamydia or gonorrhea. Ask your health care provider if you are at risk.  If you do not have HIV, but are at risk, it may be recommended that you take a prescription medicine daily to prevent HIV infection. This is called pre-exposure prophylaxis (PrEP). You are considered at risk if: ? You are sexually active and do not regularly use condoms or know the HIV status of your partner(s). ? You take drugs by  injection. ? You are sexually active with a partner who has HIV.  Talk with your health care provider about whether you are at high risk of being infected with HIV. If you choose to begin PrEP, you should first be tested for HIV. You should then be tested every 3 months for as long as you are taking PrEP. Pregnancy  If you are premenopausal and you may become pregnant, ask your health care provider about preconception  counseling.  If you may become pregnant, take 400 to 800 micrograms (mcg) of folic acid every day.  If you want to prevent pregnancy, talk to your health care provider about birth control (contraception). Osteoporosis and menopause  Osteoporosis is a disease in which the bones lose minerals and strength with aging. This can result in serious bone fractures. Your risk for osteoporosis can be identified using a bone density scan.  If you are 22 years of age or older, or if you are at risk for osteoporosis and fractures, ask your health care provider if you should be screened.  Ask your health care provider whether you should take a calcium or vitamin D supplement to lower your risk for osteoporosis.  Menopause may have certain physical symptoms and risks.  Hormone replacement therapy may reduce some of these symptoms and risks. Talk to your health care provider about whether hormone replacement therapy is right for you. Follow these instructions at home:  Schedule regular health, dental, and eye exams.  Stay current with your immunizations.  Do not use any tobacco products including cigarettes, chewing tobacco, or electronic cigarettes.  If you are pregnant, do not drink alcohol.  If you are breastfeeding, limit how much and how often you drink alcohol.  Limit alcohol intake to no more than 1 drink per day for nonpregnant women. One drink equals 12 ounces of beer, 5 ounces of wine, or 1 ounces of hard liquor.  Do not use street drugs.  Do not share needles.  Ask  your health care provider for help if you need support or information about quitting drugs.  Tell your health care provider if you often feel depressed.  Tell your health care provider if you have ever been abused or do not feel safe at home. This information is not intended to replace advice given to you by your health care provider. Make sure you discuss any questions you have with your health care provider. Document Released: 02/22/2011 Document Revised: 01/15/2016 Document Reviewed: 05/13/2015 Elsevier Interactive Patient Education  2018 [place] with Quitting Smoking Quitting smoking is a physical and mental challenge. You will face cravings, withdrawal symptoms, and temptation. Before quitting, work with your health care provider to make a plan that can help you cope. Preparation can help you quit and keep you from giving in. How can I cope with cravings? Cravings usually last for 5-10 minutes. If you get through it, the craving will pass. Consider taking the following actions to help you cope with cravings:  Keep your mouth busy: ? Chew sugar-free gum. ? Suck on hard candies or a straw. ? Brush your teeth.  Keep your hands and body busy: ? Immediately change to a different activity when you feel a craving. ? Squeeze or play with a ball. ? Do an activity or a hobby, like making bead jewelry, practicing needlepoint, or working with wood. ? Mix up your normal routine. ? Take a short exercise break. Go for a quick walk or run up and down stairs. ? Spend time in public places where smoking is not allowed.  Focus on doing something kind or helpful for someone else.  Call a friend or family member to talk during a craving.  Join a support group.  Call a quit line, such as 1-800-QUIT-NOW.  Talk with your health care provider about medicines that might help you cope with cravings and make quitting easier for you.  How can I deal with withdrawal symptoms? Your body  may experience negative effects as it tries to get used to not having nicotine in the system. These effects are called withdrawal symptoms. They may include:  Feeling hungrier than normal.  Trouble concentrating.  Irritability.  Trouble sleeping.  Feeling depressed.  Restlessness and agitation.  Craving a cigarette.  To manage withdrawal symptoms:  Avoid places, people, and activities that trigger your cravings.  Remember why you want to quit.  Get plenty of sleep.  Avoid coffee and other caffeinated drinks. These may worsen some of your symptoms.  How can I handle social situations? Social situations can be difficult when you are quitting smoking, especially in the first few weeks. To manage this, you can:  Avoid parties, bars, and other social situations where people might be smoking.  Avoid alcohol.  Leave right away if you have the urge to smoke.  Explain to your family and friends that you are quitting smoking. Ask for understanding and support.  Plan activities with friends or family where smoking is not an option.  What are some ways I can cope with stress? Wanting to smoke may cause stress, and stress can make you want to smoke. Find ways to manage your stress. Relaxation techniques can help. For example:  Breathe slowly and deeply, in through your nose and out through your mouth.  Listen to soothing, relaxing music.  Talk with a family member or friend about your stress.  Light a candle.  Soak in a bath or take a shower.  Think about a peaceful place.  What are some ways I can prevent weight gain? Be aware that many people gain weight after they quit smoking. However, not everyone does. To keep from gaining weight, have a plan in place before you quit and stick to the plan after you quit. Your plan should include:  Having healthy snacks. When you have a craving, it may help to: ? Eat plain popcorn, crunchy carrots, celery, or other cut  vegetables. ? Chew sugar-free gum.  Changing how you eat: ? Eat small portion sizes at meals. ? Eat 4-6 small meals throughout the day instead of 1-2 large meals a day. ? Be mindful when you eat. Do not watch television or do other things that might distract you as you eat.  Exercising regularly: ? Make time to exercise each day. If you do not have time for a long workout, do short bouts of exercise for 5-10 minutes several times a day. ? Do some form of strengthening exercise, like weight lifting, and some form of aerobic exercise, like running or swimming.  Drinking plenty of water or other low-calorie or no-calorie drinks. Drink 6-8 glasses of water daily, or as much as instructed by your health care provider.  Summary  Quitting smoking is a physical and mental challenge. You will face cravings, withdrawal symptoms, and temptation to smoke again. Preparation can help you as you go through these challenges.  You can cope with cravings by keeping your mouth busy (such as by chewing gum), keeping your body and hands busy, and making calls to family, friends, or a helpline for people who want to quit smoking.  You can cope with withdrawal symptoms by avoiding places where people smoke, avoiding drinks with caffeine, and getting plenty of rest.  Ask your health care provider about the different ways to prevent weight gain, avoid stress, and handle social situations. This information is not intended to replace advice given to you by your health care provider. Make sure you discuss  any questions you have with your health care provider. Document Released: 08/06/2016 Document Revised: 08/06/2016 Document Reviewed: 08/06/2016 Elsevier Interactive Patient Education  [employer].

## 2018-03-15 LAB — COMPREHENSIVE METABOLIC PANEL
AG Ratio: 1.5 (calc) (ref 1.0–2.5)
ALT: 13 U/L (ref 6–29)
AST: 17 U/L (ref 10–35)
Albumin: 4 g/dL (ref 3.6–5.1)
Alkaline phosphatase (APISO): 44 U/L (ref 33–115)
BUN/Creatinine Ratio: 27 (calc) — ABNORMAL HIGH (ref 6–22)
BUN: 13 mg/dL (ref 7–25)
CALCIUM: 8.8 mg/dL (ref 8.6–10.2)
CO2: 24 mmol/L (ref 20–32)
Chloride: 104 mmol/L (ref 98–110)
Creat: 0.49 mg/dL — ABNORMAL LOW (ref 0.50–1.10)
Globulin: 2.7 g/dL (calc) (ref 1.9–3.7)
Glucose, Bld: 89 mg/dL (ref 65–99)
Potassium: 4.4 mmol/L (ref 3.5–5.3)
Sodium: 139 mmol/L (ref 135–146)
Total Bilirubin: 0.3 mg/dL (ref 0.2–1.2)
Total Protein: 6.7 g/dL (ref 6.1–8.1)

## 2018-03-15 LAB — LIPID PANEL
CHOLESTEROL: 161 mg/dL (ref <200)
HDL: 44 mg/dL — AB (ref >50)
LDL CHOLESTEROL (CALC): 91 mg/dL
Non-HDL Cholesterol (Calc): 117 mg/dL (calc) (ref <130)
TRIGLYCERIDES: 166 mg/dL — AB (ref <150)
Total CHOL/HDL Ratio: 3.7 (calc) (ref <5.0)

## 2018-03-15 LAB — CBC WITH DIFFERENTIAL/PLATELET
BASOS ABS: 20 {cells}/uL (ref 0–200)
Basophils Relative: 0.3 %
Eosinophils Absolute: 87 cells/uL (ref 15–500)
Eosinophils Relative: 1.3 %
HEMATOCRIT: 33.9 % — AB (ref 35.0–45.0)
Hemoglobin: 10.6 g/dL — ABNORMAL LOW (ref 11.7–15.5)
LYMPHS ABS: 1883 {cells}/uL (ref 850–3900)
MCH: 24.8 pg — ABNORMAL LOW (ref 27.0–33.0)
MCHC: 31.3 g/dL — AB (ref 32.0–36.0)
MCV: 79.4 fL — AB (ref 80.0–100.0)
MPV: 11.4 fL (ref 7.5–12.5)
Monocytes Relative: 7.3 %
NEUTROS PCT: 63 %
Neutro Abs: 4221 cells/uL (ref 1500–7800)
Platelets: 444 10*3/uL — ABNORMAL HIGH (ref 140–400)
RBC: 4.27 10*6/uL (ref 3.80–5.10)
RDW: 15.4 % — AB (ref 11.0–15.0)
Total Lymphocyte: 28.1 %
WBC: 6.7 10*3/uL (ref 3.8–10.8)
WBCMIX: 489 {cells}/uL (ref 200–950)

## 2018-03-15 LAB — FOLLICLE STIMULATING HORMONE: FSH: 32.6 m[IU]/mL

## 2018-03-15 LAB — TSH: TSH: 1.05 mIU/L

## 2018-03-16 LAB — URINALYSIS, COMPLETE W/RFL CULTURE
BILIRUBIN URINE: NEGATIVE
Bacteria, UA: NONE SEEN /HPF
Glucose, UA: NEGATIVE
Hgb urine dipstick: NEGATIVE
Hyaline Cast: NONE SEEN /LPF
KETONES UR: NEGATIVE
LEUKOCYTE ESTERASE: NEGATIVE
NITRITES URINE, INITIAL: NEGATIVE
Protein, ur: NEGATIVE
RBC / HPF: NONE SEEN /HPF (ref 0–2)
Specific Gravity, Urine: 1.017 (ref 1.001–1.03)
WBC UA: NONE SEEN /HPF (ref 0–5)
pH: 7.5 (ref 5.0–8.0)

## 2018-03-16 LAB — NO CULTURE INDICATED

## 2018-03-16 LAB — PAP, TP IMAGING W/ HPV RNA, RFLX HPV TYPE 16,18/45: HPV DNA High Risk: NOT DETECTED

## 2018-05-01 ENCOUNTER — Ambulatory Visit
Admission: RE | Admit: 2018-05-01 | Discharge: 2018-05-01 | Disposition: A | Discharge status: Home / Self Care | Payer: 59 | Source: Ambulatory Visit | Attending: Women's Health | Admitting: Women's Health

## 2018-05-01 ENCOUNTER — Encounter: Payer: Self-pay | Admitting: Radiology

## 2018-05-01 DIAGNOSIS — Z1231 Encounter for screening mammogram for malignant neoplasm of breast: Secondary | ICD-10-CM

## 2019-03-19 ENCOUNTER — Encounter: Payer: 59 | Admitting: Women's Health

## 2019-03-26 ENCOUNTER — Other Ambulatory Visit: Payer: Self-pay

## 2019-03-27 ENCOUNTER — Ambulatory Visit (INDEPENDENT_AMBULATORY_CARE_PROVIDER_SITE_OTHER): Payer: 59 | Admitting: Women's Health

## 2019-03-27 ENCOUNTER — Encounter: Payer: Self-pay | Admitting: Women's Health

## 2019-03-27 ENCOUNTER — Telehealth: Payer: Self-pay | Admitting: *Deleted

## 2019-03-27 VITALS — BP 120/82 | Ht 63.0 in | Wt 195.0 lb

## 2019-03-27 DIAGNOSIS — R200 Anesthesia of skin: Secondary | ICD-10-CM | POA: Diagnosis not present

## 2019-03-27 DIAGNOSIS — Z01419 Encounter for gynecological examination (general) (routine) without abnormal findings: Secondary | ICD-10-CM | POA: Diagnosis not present

## 2019-03-27 MED ORDER — PREMARIN 0.625 MG/GM VA CREA: TOPICAL_CREAM | VAGINAL | 4 refills | Status: DC

## 2019-03-27 NOTE — Telephone Encounter (Signed)
Referral placed in epic they will call to schedule. 

## 2019-03-27 NOTE — Patient Instructions (Addendum)
Screening colonoscopy [facility] [phone]  Dr Carlean Purl  Health Maintenance, Female Adopting a healthy lifestyle and getting preventive care are important in promoting health and wellness. Ask your health care provider about:  The right schedule for you to have regular tests and exams.  Things you can do on your own to prevent diseases and keep yourself healthy. What should I know about diet, weight, and exercise? Eat a healthy diet   Eat a diet that includes plenty of vegetables, fruits, low-fat dairy products, and lean protein.  Do not eat a lot of foods that are high in solid fats, added sugars, or sodium. Maintain a healthy weight Body mass index (BMI) is used to identify weight problems. It estimates body fat based on height and weight. Your health care provider can help determine your BMI and help you achieve or maintain a healthy weight. Get regular exercise Get regular exercise. This is one of the most important things you can do for your health. Most adults should:  Exercise for at least 150 minutes each week. The exercise should increase your heart rate and make you sweat (moderate-intensity exercise).  Do strengthening exercises at least twice a week. This is in addition to the moderate-intensity exercise.  Spend less time sitting. Even light physical activity can be beneficial. Watch cholesterol and blood lipids Have your blood tested for lipids and cholesterol at 50 years of age, then have this test every 5 years. Have your cholesterol levels checked more often if:  Your lipid or cholesterol levels are high.  You are older than 50 years of age.  You are at high risk for heart disease. What should I know about cancer screening? Depending on your health history and family history, you may need to have cancer screening at various ages. This may include screening for:  Breast cancer.  Cervical cancer.  Colorectal cancer.  Skin cancer.  Lung cancer. What should I  know about heart disease, diabetes, and high blood pressure? Blood pressure and heart disease  High blood pressure causes heart disease and increases the risk of stroke. This is more likely to develop in people who have high blood pressure readings, are of African descent, or are overweight.  Have your blood pressure checked: ? Every 3-5 years if you are 45-71 years of age. ? Every year if you are 78 years old or older. Diabetes Have regular diabetes screenings. This checks your fasting blood sugar level. Have the screening done:  Once every three years after age 92 if you are at a normal weight and have a low risk for diabetes.  More often and at a younger age if you are overweight or have a high risk for diabetes. What should I know about preventing infection? Hepatitis B If you have a higher risk for hepatitis B, you should be screened for this virus. Talk with your health care provider to find out if you are at risk for hepatitis B infection. Hepatitis C Testing is recommended for:  Everyone born from [birth year] through 1967.  Anyone with known risk factors for hepatitis C. Sexually transmitted infections (STIs)  Get screened for STIs, including gonorrhea and chlamydia, if: ? You are sexually active and are younger than 50 years of age. ? You are older than 50 years of age and your health care provider tells you that you are at risk for this type of infection. ? Your sexual activity has changed since you were last screened, and you are at increased risk for  chlamydia or gonorrhea. Ask your health care provider if you are at risk.  Ask your health care provider about whether you are at high risk for HIV. Your health care provider may recommend a prescription medicine to help prevent HIV infection. If you choose to take medicine to prevent HIV, you should first get tested for HIV. You should then be tested every 3 months for as long as you are taking the medicine. Pregnancy  If you are  about to stop having your period (premenopausal) and you may become pregnant, seek counseling before you get pregnant.  Take 400 to 800 micrograms (mcg) of folic acid every day if you become pregnant.  Ask for birth control (contraception) if you want to prevent pregnancy. Osteoporosis and menopause Osteoporosis is a disease in which the bones lose minerals and strength with aging. This can result in bone fractures. If you are 46 years old or older, or if you are at risk for osteoporosis and fractures, ask your health care provider if you should:  Be screened for bone loss.  Take a calcium or vitamin D supplement to lower your risk of fractures.  Be given hormone replacement therapy (HRT) to treat symptoms of menopause. Follow these instructions at home: Lifestyle  Do not use any products that contain nicotine or tobacco, such as cigarettes, e-cigarettes, and chewing tobacco. If you need help quitting, ask your health care provider.  Do not use street drugs.  Do not share needles.  Ask your health care provider for help if you need support or information about quitting drugs. Alcohol use  Do not drink alcohol if: ? Your health care provider tells you not to drink. ? You are pregnant, may be pregnant, or are planning to become pregnant.  If you drink alcohol: ? Limit how much you use to 0-1 drink a day. ? Limit intake if you are breastfeeding.  Be aware of how much alcohol is in your drink. In the [place] one drink equals one 12 oz bottle of beer (355 mL), one 5 oz glass of wine (148 mL), or one 1 oz glass of hard liquor (44 mL). General instructions  Schedule regular health, dental, and eye exams.  Stay current with your vaccines.  Tell your health care provider if: ? You often feel depressed. ? You have ever been abused or do not feel safe at home. Summary  Adopting a healthy lifestyle and getting preventive care are important in promoting health and wellness.  Follow  your health care provider's instructions about healthy diet, exercising, and getting tested or screened for diseases.  Follow your health care provider's instructions on monitoring your cholesterol and blood pressure. This information is not intended to replace advice given to you by your health care provider. Make sure you discuss any questions you have with your health care provider. Document Released: 02/22/2011 Document Revised: 08/02/2018 Document Reviewed: 08/02/2018 Elsevier Patient Education  2020 [employer].

## 2019-03-27 NOTE — Telephone Encounter (Signed)
-----   Message from Huel Cote, NP sent at 03/27/2019  2:30 PM EDT ----- Needs nurerology consult, having bilateral leg numbness which has caused fall, trips, no known injury.  Has had for 2 years but getting worse, circulations is good, color normal, capilary refill <3 sec.,  warm, good strong pulses, (2 cousins have lupus)Has off august 17-18. If no appt avail on those days Tues Wed or Thurs ok.

## 2019-03-27 NOTE — Progress Notes (Signed)
Becky King 1971-02-07 [id number]    History:    Presents for annual exam.  2 cycles in past year /year prior every 3 months/BTL.  Hot flashes are less vaginal dryness has increased.  Normal Pap and mammogram history.  Same partner/long-term.  Biggest concern is  increased numbness in both feet and lower legs mostly to her knees but occasionally extends upward of her legs bilaterally has had one  ER visit due to fall and did have follow-up with primary care.  States the numbness is increasing which is affecting quality of life, states legs will give out on her.  Past medical history, past surgical history, family history and social history were all reviewed and documented in the EPIC chart.  Works for [employer].  3 children ages 26, 82 and 88.  Mother heart disease  ROS:  A ROS was performed and pertinent positives and negatives are included.  Exam:  Vitals:   03/27/19 1410  BP: 120/82  Weight: 195 lb (88.5 kg)  Height: 5\' 3"  (1.6 m)   Body mass index is 34.54 kg/m.   General appearance:  Normal Thyroid:  Symmetrical, normal in size, without palpable masses or nodularity. Respiratory  Auscultation:  Clear without wheezing or rhonchi Cardiovascular  Auscultation:  Regular rate, without rubs, murmurs or gallops  Edema/varicosities:  Not grossly evident Legs bilaterally less than 3-second capillary refill, warm to touch, strong pedal pulses, no edema  Abdominal  Soft,nontender, without masses, guarding or rebound.  Liver/spleen:  No organomegaly noted  Hernia:  None appreciated  Skin  Inspection:  Grossly normal   Breasts: Examined lying and sitting.     Right: Without masses, retractions, discharge or axillary adenopathy.     Left: Without masses, retractions, discharge or axillary adenopathy. Gentitourinary   Inguinal/mons:  Normal without inguinal adenopathy  External genitalia:  Normal  BUS/Urethra/Skene's glands:  Normal  Vagina: Atrophic cervix:  Normal  Uterus:    normal in size, shape and contour.  Midline and mobile  Adnexa/parametria:     Rt: Without masses or tenderness.   Lt: Without masses or tenderness.  Anus and perineum: Normal  Digital rectal exam: Normal sphincter tone without palpated masses or tenderness  Assessment/Plan:  48 y.o. S WF G3, P3 for annual exam with vaginal dryness and bilateral lower leg numbness.for the past 2 years with increasing frequency.  Perimenopausal/vaginal atrophy/condoms Smokes several cigarettes daily Bilateral lower leg numbness with good circulation, warm to touch, strong pulses bilaterally, no edema, no known injury  Obesity  Plan: Referral to neurologist to evaluate leg numbness.  SBEs, continue annual screening mammogram, calcium rich foods, vitamin D 2000 daily and increase regular cardio type exercise encouraged.  Options for vaginal dryness reviewed, will try Premarin vaginal cream reviewed minimal systemic absorption, apply 1 applicator per vagina twice weekly.  Continue over-the-counter vaginal lubricants with intercourse.  Aware need to quit smoking.  Menopause reviewed, has had 2 cycles in the past year.  CBC, CMP, ANA, TSH, Pap normal 2019, new screening guidelines reviewed.    [facility]uel [facility], 2:22 PM 03/27/2019

## 2019-03-28 LAB — COMPREHENSIVE METABOLIC PANEL
AG Ratio: 1.6 (calc) (ref 1.0–2.5)
ALT: 16 U/L (ref 6–29)
AST: 16 U/L (ref 10–35)
Albumin: 3.9 g/dL (ref 3.6–5.1)
Alkaline phosphatase (APISO): 46 U/L (ref 37–153)
BUN: 13 mg/dL (ref 7–25)
CO2: 25 mmol/L (ref 20–32)
Calcium: 8.6 mg/dL (ref 8.6–10.4)
Chloride: 108 mmol/L (ref 98–110)
Creat: 0.53 mg/dL (ref 0.50–1.05)
Globulin: 2.5 g/dL (calc) (ref 1.9–3.7)
Glucose, Bld: 102 mg/dL — ABNORMAL HIGH (ref 65–99)
Potassium: 3.9 mmol/L (ref 3.5–5.3)
Sodium: 140 mmol/L (ref 135–146)
Total Bilirubin: 0.3 mg/dL (ref 0.2–1.2)
Total Protein: 6.4 g/dL (ref 6.1–8.1)

## 2019-03-28 LAB — TSH: TSH: 1.06 mIU/L

## 2019-03-28 LAB — CBC WITH DIFFERENTIAL/PLATELET
Absolute Monocytes: 527 cells/uL (ref 200–950)
Basophils Absolute: 32 cells/uL (ref 0–200)
Basophils Relative: 0.4 %
Eosinophils Absolute: 130 cells/uL (ref 15–500)
Eosinophils Relative: 1.6 %
HCT: 37 % (ref 35.0–45.0)
Hemoglobin: 12.3 g/dL (ref 11.7–15.5)
Lymphs Abs: 2333 cells/uL (ref 850–3900)
MCH: 30.1 pg (ref 27.0–33.0)
MCHC: 33.2 g/dL (ref 32.0–36.0)
MCV: 90.7 fL (ref 80.0–100.0)
MPV: 11 fL (ref 7.5–12.5)
Monocytes Relative: 6.5 %
Neutro Abs: 5079 cells/uL (ref 1500–7800)
Neutrophils Relative %: 62.7 %
Platelets: 433 10*3/uL — ABNORMAL HIGH (ref 140–400)
RBC: 4.08 10*6/uL (ref 3.80–5.10)
RDW: 14.9 % (ref 11.0–15.0)
Total Lymphocyte: 28.8 %
WBC: 8.1 10*3/uL (ref 3.8–10.8)

## 2019-03-28 LAB — ANA: Anti Nuclear Antibody (ANA): NEGATIVE

## 2019-03-29 ENCOUNTER — Encounter: Payer: Self-pay | Admitting: Women's Health

## 2019-04-02 NOTE — Telephone Encounter (Signed)
Patient scheduled on 05/07/19 @ 8:00am with Dr. Jaynee Eagles

## 2019-04-03 ENCOUNTER — Other Ambulatory Visit: Payer: Self-pay | Admitting: Women's Health

## 2019-04-03 DIAGNOSIS — Z1231 Encounter for screening mammogram for malignant neoplasm of breast: Secondary | ICD-10-CM

## 2019-05-01 ENCOUNTER — Telehealth: Payer: Self-pay | Admitting: *Deleted

## 2019-05-01 NOTE — Telephone Encounter (Signed)
Called pt & LVM advising MD will be out of the office on Monday 9/14 and pt's 8:00 AM appointment will need to be rescheduled. Left office number in message and asked for a call back asap.

## 2019-05-02 ENCOUNTER — Ambulatory Visit (INDEPENDENT_AMBULATORY_CARE_PROVIDER_SITE_OTHER): Payer: 59 | Admitting: Neurology

## 2019-05-02 ENCOUNTER — Encounter: Payer: Self-pay | Admitting: Neurology

## 2019-05-02 ENCOUNTER — Other Ambulatory Visit: Payer: Self-pay

## 2019-05-02 VITALS — BP 109/67 | HR 83 | Temp 97.3°F | Ht 63.0 in | Wt 190.3 lb

## 2019-05-02 DIAGNOSIS — M6281 Muscle weakness (generalized): Secondary | ICD-10-CM | POA: Diagnosis not present

## 2019-05-02 DIAGNOSIS — R202 Paresthesia of skin: Secondary | ICD-10-CM | POA: Diagnosis not present

## 2019-05-02 NOTE — Progress Notes (Signed)
Reason for visit: Paresthesias, leg weakness  Referring physician: Dr. Janene MadeiraYoung  Becky King is a 48 y.o. female  History of present illness:  Ms. Lesly Rubensteinarrott is a 48 year old right-handed white female with a history of intermittent numbness and paresthesias in the hands that dates back 15 to 20 years.  Within the last year, she has begun having problems with balance and with falls, she may feel her legs will give out from under her, she has fallen on several occasions.  Within the last 3 to 4 months, she has begun having numbness that has been persistent in the back of the legs below the knees and in the feet.  When she walks, she may have sudden worsening of the numbness that goes above the knees into the mid thighs, and her legs may feel weak and may collapse.  She has had difficulty taking a bath because she can no longer stand from a seated position very well.  She has difficulty climbing stairs.  She has noted within the last 6 months that she is having increasing problems controlling her bladder.  She has had sensory alteration that is equal from 1 leg to the next.  She denies any numbness of the body.  She does have some occasional low back pain, she denies any neck pain or pain down the arms.  She also has a history of migraine headaches that may occur 2-3 times a week unassociated with photophobia or phonophobia.  She is sent to this office for further evaluation.  She denies any vision changes or speech changes.  Past Medical History:  Diagnosis Date  . Kidney stones   . Seasonal allergies     Past Surgical History:  Procedure Laterality Date  . APPENDECTOMY    . HERNIA REPAIR    . TUBAL LIGATION      Family History  Problem Relation Age of Onset  . Fibromyalgia Mother   . Heart disease Mother   . Diabetes Maternal Grandmother     Social history:  reports that she has been smoking cigarettes. She has been smoking about 0.00 packs per day for the past 0.00 years. She  has never used smokeless tobacco. She reports previous alcohol use. She reports that she does not use drugs.  Medications:  Prior to Admission medications   Medication Sig Start Date End Date Taking? Authorizing Provider  acetaminophen (TYLENOL) 500 MG tablet Take 500 mg by mouth every 6 (six) hours as needed for moderate pain.    [provider]  conjugated estrogens (PREMARIN) vaginal cream 1 applicator .625mg  per vagina 2 times a week 03/27/19   Harrington ChallengerYoung, Nancy J, NP  fexofenadine-pseudoephedrine (ALLEGRA-D 24) 180-240 MG per 24 hr tablet Take 1 tablet by mouth every morning.     [provider]  fluticasone (FLONASE) 50 MCG/ACT nasal spray Place 1 spray into both nostrils daily. Patient taking differently: Place 1 spray into both nostrils daily as needed for allergies.  12/17/16   Maxwell CaulLayden, Lindsey A, PA-C  OVER THE COUNTER MEDICATION Apply 1 application topically 2 (two) times daily. Psoriasis lotion    [provider]  OVER THE COUNTER MEDICATION Apply 1 application topically daily. Psoriasis scalp solution    [provider]  polyvinyl alcohol (LIQUIFILM TEARS) 1.4 % ophthalmic solution Place 1 drop into both eyes as needed. Bausch & Lomb For dry eyes    [provider]      Allergies  Allergen Reactions  . Latex Itching, Rash  and Other (See Comments)    Cause blisters also    ROS:  Out of a complete 14 system review of symptoms, the patient complains only of the following symptoms, and all other reviewed systems are negative.  Numbness Walking problem Headache  Blood pressure 109/67, pulse 83, temperature (!) 97.3 F (36.3 C), temperature source Temporal, height 5\' 3"  (1.6 m), weight 190 lb 5 oz (86.3 kg).  Physical Exam  General: The patient is alert and cooperative at the time of the examination.  The patient is moderately obese.  Eyes: Pupils are equal, round, and reactive to light. Discs are flat bilaterally.  Neck: The neck is  supple, no carotid bruits are noted.  Respiratory: The respiratory examination is clear.  Cardiovascular: The cardiovascular examination reveals a regular rate and rhythm, no obvious murmurs or rubs are noted.   Neuromuscular: Range move the cervical spine and lumbar spine are full.  Skin: Extremities are without significant edema.  Neurologic Exam  Mental status: The patient is alert and oriented x 3 at the time of the examination. The patient has apparent normal recent and remote memory, with an apparently normal attention span and concentration ability.  Cranial nerves: Facial symmetry is present. There is good sensation of the face to pinprick and soft touch bilaterally. The strength of the facial muscles and the muscles to head turning and shoulder shrug are normal bilaterally. Speech is well enunciated, no aphasia or dysarthria is noted. Extraocular movements are full. Visual fields are full. The tongue is midline, and the patient has symmetric elevation of the soft palate. No obvious hearing deficits are noted.  Motor: The motor testing reveals 5 over 5 strength of all 4 extremities, with exception of 4/5 strength with hip flexion bilaterally. Good symmetric motor tone is noted throughout.  Sensory: Sensory testing is intact to pinprick, soft touch, vibration sensation, and position sense on all 4 extremities, with exception of decreased vibration sensation on the left foot as compared to the right.  No stocking pinprick sensory deficit was noted. No evidence of extinction is noted.  Coordination: Cerebellar testing reveals good finger-nose-finger and heel-to-shin bilaterally.  Gait and station: Gait is minimally wide-based. Tandem gait is normal. Romberg is negative. No drift is seen.  The patient is able to walk on heels and the toes bilaterally.  She can arise from a seated position with arms crossed.  Reflexes: Deep tendon reflexes are symmetric, but are depressed to absent  bilaterally. Toes are downgoing bilaterally.   Assessment/Plan:  1.  Onset of lower extremity numbness, weakness  The patient does have some proximal leg weakness bilaterally, she reports subjective sensory changes.  Clinical examination shows depression of reflexes.  The patient will need to be evaluated for neuropathy such as CIDP and will need to be evaluated for possible lumbosacral spinal stenosis.  She will have blood work done today, nerve conduction studies will be done on both legs and one arm, EMG on the left leg.  She will have MRI of the lumbar spine.  She will otherwise follow-up in 3 months.  Jill Alexanders MD 05/02/2019 9:55 AM  [facility] [address] [place], [place] [address]  Phone [phone] Fax [phone]

## 2019-05-03 ENCOUNTER — Telehealth: Payer: Self-pay | Admitting: Neurology

## 2019-05-03 NOTE — Telephone Encounter (Signed)
I contacted the pt and we were able to schedule nurse injection for 05/08/2019 at 1230 pm.

## 2019-05-03 NOTE — Telephone Encounter (Signed)
Primary blood work shows a low B12 level, will have the patient come in for B12 shot, she will go on 1000 mcg tablets of B12.

## 2019-05-07 ENCOUNTER — Telehealth: Payer: Self-pay | Admitting: Neurology

## 2019-05-07 ENCOUNTER — Ambulatory Visit: Payer: 59 | Admitting: Neurology

## 2019-05-07 LAB — MULTIPLE MYELOMA PANEL, SERUM
Albumin SerPl Elph-Mcnc: 3.7 g/dL (ref 2.9–4.4)
Albumin/Glob SerPl: 1.3 (ref 0.7–1.7)
Alpha 1: 0.2 g/dL (ref 0.0–0.4)
Alpha2 Glob SerPl Elph-Mcnc: 0.8 g/dL (ref 0.4–1.0)
B-Globulin SerPl Elph-Mcnc: 0.9 g/dL (ref 0.7–1.3)
Gamma Glob SerPl Elph-Mcnc: 1.1 g/dL (ref 0.4–1.8)
Globulin, Total: 3 g/dL (ref 2.2–3.9)
IgA/Immunoglobulin A, Serum: 241 mg/dL (ref 87–352)
IgG (Immunoglobin G), Serum: 1016 mg/dL (ref 586–1602)
IgM (Immunoglobulin M), Srm: 222 mg/dL — ABNORMAL HIGH (ref 26–217)
Total Protein: 6.7 g/dL (ref 6.0–8.5)

## 2019-05-07 LAB — RPR: RPR Ser Ql: NONREACTIVE

## 2019-05-07 LAB — ANGIOTENSIN CONVERTING ENZYME: Angio Convert Enzyme: 45 U/L (ref 14–82)

## 2019-05-07 LAB — RHEUMATOID FACTOR: Rheumatoid fact SerPl-aCnc: 12.8 IU/mL (ref 0.0–13.9)

## 2019-05-07 LAB — B. BURGDORFI ANTIBODIES: Lyme IgG/IgM Ab: <0.91 {ISR} (ref 0.00–0.90)

## 2019-05-07 LAB — COPPER, SERUM: Copper: 117 ug/dL (ref 72–166)

## 2019-05-07 LAB — SEDIMENTATION RATE: Sed Rate: 14 mm/hr (ref 0–40)

## 2019-05-07 LAB — VITAMIN B12: Vitamin B-12: 131 pg/mL — ABNORMAL LOW (ref 232–1245)

## 2019-05-07 NOTE — Telephone Encounter (Signed)
LVM for pt to call back about scheduling mri. °[employer] Auth: [place] via uhc website °

## 2019-05-07 NOTE — Telephone Encounter (Signed)
Patient returned my call. no to the covid questions MR Lumbar spine wo contrast Dr. Jannifer Franklin [facility] Auth: [place] via uhc website. Patient is scheduled at [facility] for 05/09/19.

## 2019-05-08 ENCOUNTER — Ambulatory Visit (INDEPENDENT_AMBULATORY_CARE_PROVIDER_SITE_OTHER): Payer: 59 | Admitting: Neurology

## 2019-05-08 ENCOUNTER — Other Ambulatory Visit: Payer: Self-pay

## 2019-05-08 DIAGNOSIS — E538 Deficiency of other specified B group vitamins: Secondary | ICD-10-CM

## 2019-05-08 MED ORDER — CYANOCOBALAMIN 1000 MCG/ML IJ SOLN
1000.0000 ug | Freq: Once | INTRAMUSCULAR | Status: AC
  Administered 2019-05-08: 1000 ug via INTRAMUSCULAR

## 2019-05-09 ENCOUNTER — Ambulatory Visit: Payer: 59

## 2019-05-09 ENCOUNTER — Other Ambulatory Visit: Payer: Self-pay

## 2019-05-09 DIAGNOSIS — R202 Paresthesia of skin: Secondary | ICD-10-CM

## 2019-05-09 DIAGNOSIS — M6281 Muscle weakness (generalized): Secondary | ICD-10-CM | POA: Diagnosis not present

## 2019-05-13 ENCOUNTER — Telehealth: Payer: Self-pay | Admitting: Neurology

## 2019-05-13 NOTE — Telephone Encounter (Signed)
I called the patient.  MRI of the lumbar spine was normal.  The patient has gotten a B12 injection for a low B12 level.  She will be having EMG and nerve conduction study in the near future.   MRI lumbar 05/12/19:  IMPRESSION:   Normal MRI lumbar spine (without).

## 2019-05-29 ENCOUNTER — Ambulatory Visit (INDEPENDENT_AMBULATORY_CARE_PROVIDER_SITE_OTHER): Payer: 59 | Admitting: Neurology

## 2019-05-29 ENCOUNTER — Encounter: Payer: Self-pay | Admitting: Neurology

## 2019-05-29 ENCOUNTER — Other Ambulatory Visit: Payer: Self-pay

## 2019-05-29 DIAGNOSIS — R202 Paresthesia of skin: Secondary | ICD-10-CM

## 2019-05-29 DIAGNOSIS — M6281 Muscle weakness (generalized): Secondary | ICD-10-CM | POA: Diagnosis not present

## 2019-05-29 NOTE — Progress Notes (Signed)
Please refer to EMG and nerve conduction procedure note.  

## 2019-05-29 NOTE — Procedures (Signed)
     HISTORY:  Becky King is a 48 year old patient with a history of onset of paresthesias affecting the legs and hands associated with a gait disorder with episodic falls.  She was found to have a low vitamin B12 level but supplementation has not improved her symptoms.  The patient is being evaluated for a possible neuropathy or a myopathic disorder.  NERVE CONDUCTION STUDIES:  Nerve conduction studies were performed on the left upper extremity. The distal motor latencies and motor amplitudes for the median and ulnar nerves were within normal limits. The nerve conduction velocities for these nerves were also normal. The sensory latencies for the median and ulnar nerves were normal. The F wave latencies for the ulnar nerves were within normal limits.  Nerve conduction studies were performed on both lower extremities. The distal motor latencies and motor amplitudes for the peroneal and posterior tibial nerves were within normal limits. The nerve conduction velocities for these nerves were also normal. The sensory latencies for the peroneal and sural nerves were within normal limits. The F wave latencies for the posterior tibial nerves were within normal limits.  The F-wave latencies for the peroneal nerves were unobtainable.   EMG STUDIES:  EMG study was performed on the left lower extremity:  The tibialis anterior muscle reveals 2 to 4K motor units with full recruitment. No fibrillations or positive waves were seen. The peroneus tertius muscle reveals 2 to 4K motor units with full recruitment. No fibrillations or positive waves were seen. The medial gastrocnemius muscle reveals 1 to 3K motor units with full recruitment. No fibrillations or positive waves were seen. The vastus lateralis muscle reveals 2 to 4K motor units with full recruitment. No fibrillations or positive waves were seen. The iliopsoas muscle reveals 2 to 4K motor units with full recruitment. No fibrillations or positive  waves were seen. The biceps femoris muscle (long head) reveals 2 to 4K motor units with full recruitment. No fibrillations or positive waves were seen. The lumbosacral paraspinal muscles were tested at 3 levels, and revealed no abnormalities of insertional activity at all 3 levels tested. There was good relaxation.   IMPRESSION:  Nerve conduction studies done on the left upper extremity and both lower extremities were unremarkable without evidence of a peripheral neuropathy.  EMG evaluation of the left lower extremity was unremarkable, no evidence of a radiculopathy or a myopathic disorder was seen.  Jill Alexanders MD 05/29/2019 1:41 PM  [facility] [address] [place], [place] [address]  Phone [phone] Fax [phone]

## 2019-05-29 NOTE — Progress Notes (Addendum)
The patient comes in for EMG and nerve conduction study evaluation.  She continues to have some numbness on all 4 extremities, she has a gait disturbance and has had falls.  She was found to have a low vitamin B12 level but an injection of vitamin B12 and oral supplementation have not improved her symptoms.  For this reason, we will pursue further evaluation with MRI of the brain and cervical spine to exclude demyelinating disease as a potential source of her walking problems and sensory changes.  The nerve conduction study evaluation and EMG were unremarkable, no evidence of a neuropathy or a myopathic disorder was seen.    MNC    Nerve / Sites Muscle Latency Ref. Amplitude Ref. Rel Amp Segments Distance Velocity Ref. Area    ms ms mV mV %  cm m/s m/s mVms  L Median - APB     Wrist APB 2.7 ?4.4 5.8 ?4.0 100 Wrist - APB 7   24.5     Upper arm APB 5.9  5.7  97.4 Upper arm - Wrist 19 58 ?49 23.6  L Ulnar - ADM     Wrist ADM 2.2 ?3.3 8.5 ?6.0 100 Wrist - ADM 7   23.2     B.Elbow ADM 4.7  7.4  87.8 B.Elbow - Wrist 17 68 ?49 21.2     A.Elbow ADM 6.1  7.0  94.5 A.Elbow - B.Elbow 10 69 ?49 21.8         A.Elbow - Wrist      L Peroneal - EDB     Ankle EDB 4.2 ?6.5 5.0 ?2.0 100 Ankle - EDB 9   12.8     Fib head EDB 9.4  4.1  83.3 Fib head - Ankle 25 48 ?44 11.5     Pop fossa EDB 11.5  4.0  95.9 Pop fossa - Fib head 10 48 ?44 11.3         Pop fossa - Ankle      R Peroneal - EDB     Ankle EDB 4.1 ?6.5 4.6 ?2.0 100 Ankle - EDB 9   14.8     Fib head EDB 9.1  4.0  86.4 Fib head - Ankle 25 51 ?44 12.9     Pop fossa EDB 11.0  3.9  96.6 Pop fossa - Fib head 10 52 ?44 12.7         Pop fossa - Ankle      L Tibial - AH     Ankle AH 3.0 ?5.8 12.6 ?4.0 100 Ankle - AH 9   20.7     Pop fossa AH 10.8  9.9  78.5 Pop fossa - Ankle 36 46 ?41 20.5  R Tibial - AH     Ankle AH 3.3 ?5.8 13.5 ?4.0 100 Ankle - AH 9   27.1     Pop fossa AH 10.0  9.9  73.3 Pop fossa - Ankle 36 54 ?41 22.4                 SNC     Nerve / Sites Rec. Site Peak Lat Ref.  Amp Ref. Segments Distance    ms ms V V  cm  L Sural - Ankle (Calf)     Calf Ankle 3.4 ?4.4 10 ?6 Calf - Ankle 14  R Sural - Ankle (Calf)     Calf Ankle 3.3 ?4.4 10 ?6 Calf - Ankle 14  L Superficial peroneal - Ankle  Lat leg Ankle 3.8 ?4.4 3 ?6 Lat leg - Ankle 14  R Superficial peroneal - Ankle     Lat leg Ankle 3.5 ?4.4 6 ?6 Lat leg - Ankle 14  L Median - Orthodromic (Dig II, Mid palm)     Dig II Wrist 2.5 ?3.4 20 ?10 Dig II - Wrist 13  L Ulnar - Orthodromic, (Dig V, Mid palm)     Dig V Wrist 2.7 ?3.1 11 ?5 Dig V - Wrist 3                 F  Wave    Nerve F Lat Ref.   ms ms  L Peroneal - EDB NR ?56.0  L Tibial - AH 45.4 ?56.0  R Peroneal - EDB NR ?56.0  R Tibial - AH 45.2 ?56.0  L Median - APB 23.5 ?31.0  L Ulnar - ADM 26.6 ?32.0

## 2019-05-31 ENCOUNTER — Telehealth: Payer: Self-pay | Admitting: Neurology

## 2019-05-31 NOTE — Telephone Encounter (Signed)
no to the covid questions MR Brain w/wo contrast & MR Cervical spine w/wo contrast Dr. Jannifer Franklin [facility] Auth: [place] via uhc website. Patient is scheduled at [facility] for 06/13/19.

## 2019-06-13 ENCOUNTER — Other Ambulatory Visit: Payer: Self-pay

## 2019-06-13 ENCOUNTER — Ambulatory Visit: Payer: 59

## 2019-06-13 DIAGNOSIS — M6281 Muscle weakness (generalized): Secondary | ICD-10-CM | POA: Diagnosis not present

## 2019-06-13 DIAGNOSIS — R202 Paresthesia of skin: Secondary | ICD-10-CM | POA: Diagnosis not present

## 2019-06-13 MED ORDER — GADOBENATE DIMEGLUMINE 529 MG/ML IV SOLN
19.0000 mL | Freq: Once | INTRAVENOUS | Status: AC | PRN
  Administered 2019-06-13: 18:00:00 19 mL via INTRAVENOUS

## 2019-06-15 ENCOUNTER — Telehealth: Payer: Self-pay | Admitting: Neurology

## 2019-06-15 NOTE — Telephone Encounter (Signed)
I called the patient.  MRI of the brain and cervical spine were completely normal.  No evidence of demyelinating disease.  If the patient is not getting any benefit with B12 supplementation, we may consider further blood work.  The vitamin B12 deficiency documented could explain much of her symptom complex however.   MRI cervical 06/14/19:  IMPRESSION: Unremarkable MRI scan cervical spine with and without contrast   MRI brain 06/14/19:  IMPRESSION: Unremarkable MRI scan of the brain with and without contrast

## 2019-07-23 ENCOUNTER — Ambulatory Visit: Payer: 59

## 2019-07-30 ENCOUNTER — Ambulatory Visit (INDEPENDENT_AMBULATORY_CARE_PROVIDER_SITE_OTHER): Payer: 59 | Admitting: Neurology

## 2019-07-30 ENCOUNTER — Encounter: Payer: Self-pay | Admitting: Neurology

## 2019-07-30 ENCOUNTER — Other Ambulatory Visit: Payer: Self-pay

## 2019-07-30 DIAGNOSIS — E538 Deficiency of other specified B group vitamins: Secondary | ICD-10-CM

## 2019-07-30 HISTORY — DX: Deficiency of other specified B group vitamins: E53.8

## 2019-07-30 NOTE — Progress Notes (Signed)
Reason for visit: Paresthesias, gait disorder  Becky King is an 48 y.o. female  History of present illness:  Becky King is a 48 year old right-handed white female with a history of paresthesias affecting mainly the lower extremities and some changes in her ability to ambulate.  Blood work revealed evidence of a significantly low vitamin B12 level, she has gone on oral supplementation after a B12 injection.  She believes that over time her sensation has improved in the lower extremities, but has not normalized.  She has had 1 fall since last seen, this occurred 1 week ago.  She has not noted any new issues since last seen.  She returns for an evaluation.  She has had EMG nerve conduction studies that did not show evidence of a peripheral neuropathy.  MRI of the brain, cervical spine, and lumbar spine did not show evidence of demyelinating disease or spinal stenosis that would cause a gait disturbance or sensory alteration.  Past Medical History:  Diagnosis Date  . Kidney stones   . Seasonal allergies     Past Surgical History:  Procedure Laterality Date  . APPENDECTOMY    . HERNIA REPAIR    . TUBAL LIGATION      Family History  Problem Relation Age of Onset  . Fibromyalgia Mother   . Heart disease Mother   . Diabetes Maternal Grandmother     Social history:  reports that she has been smoking cigarettes. She has been smoking about 0.00 packs per day for the past 0.00 years. She has never used smokeless tobacco. She reports previous alcohol use. She reports that she does not use drugs.    Allergies  Allergen Reactions  . Latex Itching, Rash and Other (See Comments)    Cause blisters also    Medications:  Prior to Admission medications   Medication Sig Start Date End Date Taking? Authorizing Provider  acetaminophen (TYLENOL) 500 MG tablet Take 500 mg by mouth every 6 (six) hours as needed for moderate pain.   Yes [provider]  conjugated estrogens  (PREMARIN) vaginal cream 1 applicator .625mg  per vagina 2 times a week 03/27/19  Yes Young, Candiss Norse, NP  Cyanocobalamin (VITAMIN B 12 PO) Take by mouth.   Yes [provider]  fexofenadine-pseudoephedrine (ALLEGRA-D 24) 180-240 MG per 24 hr tablet Take 1 tablet by mouth every morning.    Yes [provider]  fluticasone (FLONASE) 50 MCG/ACT nasal spray Place 1 spray into both nostrils daily. Patient taking differently: Place 1 spray into both nostrils daily as needed for allergies.  12/17/16  Yes Providence Lanius A, PA-C  OVER THE COUNTER MEDICATION Apply 1 application topically 2 (two) times daily. Psoriasis lotion   Yes [provider]  OVER THE COUNTER MEDICATION Apply 1 application topically daily. Psoriasis scalp solution   Yes [provider]  polyvinyl alcohol (LIQUIFILM TEARS) 1.4 % ophthalmic solution Place 1 drop into both eyes as needed. Bausch & Lomb For dry eyes   Yes [provider]    ROS:  Out of a complete 14 system review of symptoms, the patient complains only of the following symptoms, and all other reviewed systems are negative.  Numbness, paresthesias Walking difficulty  Blood pressure 100/62, pulse 72, temperature (!) 97 F (36.1 C), temperature source Temporal, height 5\' 3"  (1.6 m), weight 183 lb 5 oz (83.2 kg).  Physical Exam  General: The patient is alert and cooperative at the time of the examination.  Skin: No  significant peripheral edema is noted.   Neurologic Exam  Mental status: The patient is alert and oriented x 3 at the time of the examination. The patient has apparent normal recent and remote memory, with an apparently normal attention span and concentration ability.   Cranial nerves: Facial symmetry is present. Speech is normal, no aphasia or dysarthria is noted. Extraocular movements are full. Visual fields are full.  Motor: The patient has good strength in all 4 extremities.  Sensory examination: Soft  touch sensation is symmetric on the face, arms, and legs.  Coordination: The patient has good finger-nose-finger and heel-to-shin bilaterally.  Gait and station: The patient has a normal gait. Tandem gait is normal. Romberg is negative. No drift is seen.  Reflexes: Deep tendon reflexes are symmetric, but are depressed.   Assessment/Plan:  1.  Vitamin B12 deficiency  The patient has gained some improvement with oral supplementation of the vitamin B12, we will check blood work today to ensure that the blood levels are adequate.  If not, she will go on IM injections of vitamin B12.  The patient will follow up at least 1 more time in 6 months to ensure good stability.  Marlan Palau MD 07/30/2019 11:53 AM  [facility] [address] [place], [place] [address]  Phone [phone] Fax [phone]

## 2019-07-31 LAB — VITAMIN B12: Vitamin B-12: 448 pg/mL (ref 232–1245)

## 2019-09-20 ENCOUNTER — Ambulatory Visit: Payer: 59

## 2019-10-17 ENCOUNTER — Other Ambulatory Visit: Payer: Self-pay

## 2019-10-17 ENCOUNTER — Ambulatory Visit
Admission: RE | Admit: 2019-10-17 | Discharge: 2019-10-17 | Disposition: A | Discharge status: Home / Self Care | Payer: BC Managed Care – PPO | Source: Ambulatory Visit | Attending: Women's Health | Admitting: Women's Health

## 2019-10-17 DIAGNOSIS — Z1231 Encounter for screening mammogram for malignant neoplasm of breast: Secondary | ICD-10-CM

## 2019-10-17 DIAGNOSIS — H6993 Unspecified Eustachian tube disorder, bilateral: Secondary | ICD-10-CM | POA: Insufficient documentation

## 2019-10-17 DIAGNOSIS — H903 Sensorineural hearing loss, bilateral: Secondary | ICD-10-CM | POA: Insufficient documentation

## 2019-11-19 ENCOUNTER — Ambulatory Visit: Payer: BC Managed Care – PPO | Attending: Internal Medicine

## 2019-11-19 DIAGNOSIS — Z23 Encounter for immunization: Secondary | ICD-10-CM

## 2019-11-19 NOTE — Progress Notes (Signed)
   Covid-19 Vaccination Clinic  Name:  Becky King    MRN: [id number] DOB: 1971/02/01  11/19/2019  [name] was observed post Covid-19 immunization for 15 minutes without incident. She was provided with Vaccine Information Sheet and instruction to access the V-Safe system.   [name] was instructed to call 911 with any severe reactions post vaccine: Marland Kitchen Difficulty breathing  . Swelling of face and throat  . A fast heartbeat  . A bad rash all over body  . Dizziness and weakness   Immunizations Administered    Name Date Dose VIS Date Route   Pfizer COVID-19 Vaccine 11/19/2019  9:50 AM 0.3 mL 08/03/2019 Intramuscular   Manufacturer: [employer], [employer]   Lot: [id number]   NDC: [address]-0

## 2019-12-12 ENCOUNTER — Ambulatory Visit: Payer: BC Managed Care – PPO | Attending: Internal Medicine

## 2019-12-12 DIAGNOSIS — Z23 Encounter for immunization: Secondary | ICD-10-CM

## 2019-12-12 NOTE — Progress Notes (Signed)
   Covid-19 Vaccination Clinic  Name:  Becky King    MRN: [id number] DOB: 25-May-1971  12/12/2019  [name] was observed post Covid-19 immunization for 15 minutes without incident. She was provided with Vaccine Information Sheet and instruction to access the V-Safe system.   [name] was instructed to call 911 with any severe reactions post vaccine: Marland Kitchen Difficulty breathing  . Swelling of face and throat  . A fast heartbeat  . A bad rash all over body  . Dizziness and weakness   Immunizations Administered    Name Date Dose VIS Date Route   Pfizer COVID-19 Vaccine 12/12/2019  3:05 PM 0.3 mL 10/17/2018 Intramuscular   Manufacturer: [employer], [employer]   Lot: [id number]   NDC: [address]-9

## 2020-01-29 ENCOUNTER — Ambulatory Visit: Payer: 59 | Admitting: Neurology

## 2020-10-13 ENCOUNTER — Encounter: Payer: Self-pay | Admitting: Internal Medicine

## 2020-10-14 ENCOUNTER — Other Ambulatory Visit: Payer: Self-pay

## 2020-10-14 ENCOUNTER — Other Ambulatory Visit: Payer: Self-pay | Admitting: Nurse Practitioner

## 2020-10-14 ENCOUNTER — Ambulatory Visit (INDEPENDENT_AMBULATORY_CARE_PROVIDER_SITE_OTHER): Payer: BC Managed Care – PPO | Admitting: Nurse Practitioner

## 2020-10-14 ENCOUNTER — Encounter: Payer: Self-pay | Admitting: Nurse Practitioner

## 2020-10-14 VITALS — BP 112/62 | HR 76 | Ht 62.75 in | Wt 180.0 lb

## 2020-10-14 DIAGNOSIS — Z780 Asymptomatic menopausal state: Secondary | ICD-10-CM

## 2020-10-14 DIAGNOSIS — N898 Other specified noninflammatory disorders of vagina: Secondary | ICD-10-CM

## 2020-10-14 DIAGNOSIS — Z01419 Encounter for gynecological examination (general) (routine) without abnormal findings: Secondary | ICD-10-CM | POA: Diagnosis not present

## 2020-10-14 DIAGNOSIS — N6452 Nipple discharge: Secondary | ICD-10-CM | POA: Diagnosis not present

## 2020-10-14 DIAGNOSIS — Z1231 Encounter for screening mammogram for malignant neoplasm of breast: Secondary | ICD-10-CM

## 2020-10-14 LAB — PROLACTIN: Prolactin: 16.1 ng/mL

## 2020-10-14 MED ORDER — PREMARIN 0.625 MG/GM VA CREA: TOPICAL_CREAM | VAGINAL | 4 refills | Status: DC

## 2020-10-14 NOTE — Patient Instructions (Signed)
Health Maintenance, Female Adopting a healthy lifestyle and getting preventive care are important in promoting health and wellness. Ask your health care provider about:  The right schedule for you to have regular tests and exams.  Things you can do on your own to prevent diseases and keep yourself healthy. What should I know about diet, weight, and exercise? Eat a healthy diet  Eat a diet that includes plenty of vegetables, fruits, low-fat dairy products, and lean protein.  Do not eat a lot of foods that are high in solid fats, added sugars, or sodium.   Maintain a healthy weight Body mass index (BMI) is used to identify weight problems. It estimates body fat based on height and weight. Your health care provider can help determine your BMI and help you achieve or maintain a healthy weight. Get regular exercise Get regular exercise. This is one of the most important things you can do for your health. Most adults should:  Exercise for at least 150 minutes each week. The exercise should increase your heart rate and make you sweat (moderate-intensity exercise).  Do strengthening exercises at least twice a week. This is in addition to the moderate-intensity exercise.  Spend less time sitting. Even light physical activity can be beneficial. Watch cholesterol and blood lipids Have your blood tested for lipids and cholesterol at 52 years of age, then have this test every 5 years. Have your cholesterol levels checked more often if:  Your lipid or cholesterol levels are high.  You are older than 52 years of age.  You are at high risk for heart disease. What should I know about cancer screening? Depending on your health history and family history, you may need to have cancer screening at various ages. This may include screening for:  Breast cancer.  Cervical cancer.  Colorectal cancer.  Skin cancer.  Lung cancer. What should I know about heart disease, diabetes, and high blood  pressure? Blood pressure and heart disease  High blood pressure causes heart disease and increases the risk of stroke. This is more likely to develop in people who have high blood pressure readings, are of African descent, or are overweight.  Have your blood pressure checked: ? Every 3-5 years if you are 18-39 years of age. ? Every year if you are 40 years old or older. Diabetes Have regular diabetes screenings. This checks your fasting blood sugar level. Have the screening done:  Once every three years after age 40 if you are at a normal weight and have a low risk for diabetes.  More often and at a younger age if you are overweight or have a high risk for diabetes. What should I know about preventing infection? Hepatitis B If you have a higher risk for hepatitis B, you should be screened for this virus. Talk with your health care provider to find out if you are at risk for hepatitis B infection. Hepatitis C Testing is recommended for:  Everyone born from 1945 through 1965.  Anyone with known risk factors for hepatitis C. Sexually transmitted infections (STIs)  Get screened for STIs, including gonorrhea and chlamydia, if: ? You are sexually active and are younger than 52 years of age. ? You are older than 52 years of age and your health care provider tells you that you are at risk for this type of infection. ? Your sexual activity has changed since you were last screened, and you are at increased risk for chlamydia or gonorrhea. Ask your health care provider   if you are at risk.  Ask your health care provider about whether you are at high risk for HIV. Your health care provider may recommend a prescription medicine to help prevent HIV infection. If you choose to take medicine to prevent HIV, you should first get tested for HIV. You should then be tested every 3 months for as long as you are taking the medicine. Pregnancy  If you are about to stop having your period (premenopausal) and  you may become pregnant, seek counseling before you get pregnant.  Take 400 to 800 micrograms (mcg) of folic acid every day if you become pregnant.  Ask for birth control (contraception) if you want to prevent pregnancy. Osteoporosis and menopause Osteoporosis is a disease in which the bones lose minerals and strength with aging. This can result in bone fractures. If you are 65 years old or older, or if you are at risk for osteoporosis and fractures, ask your health care provider if you should:  Be screened for bone loss.  Take a calcium or vitamin D supplement to lower your risk of fractures.  Be given hormone replacement therapy (HRT) to treat symptoms of menopause. Follow these instructions at home: Lifestyle  Do not use any products that contain nicotine or tobacco, such as cigarettes, e-cigarettes, and chewing tobacco. If you need help quitting, ask your health care provider.  Do not use street drugs.  Do not share needles.  Ask your health care provider for help if you need support or information about quitting drugs. Alcohol use  Do not drink alcohol if: ? Your health care provider tells you not to drink. ? You are pregnant, may be pregnant, or are planning to become pregnant.  If you drink alcohol: ? Limit how much you use to 0-1 drink a day. ? Limit intake if you are breastfeeding.  Be aware of how much alcohol is in your drink. In the [place] one drink equals one 12 oz bottle of beer (355 mL), one 5 oz glass of wine (148 mL), or one 1 oz glass of hard liquor (44 mL). General instructions  Schedule regular health, dental, and eye exams.  Stay current with your vaccines.  Tell your health care provider if: ? You often feel depressed. ? You have ever been abused or do not feel safe at home. Summary  Adopting a healthy lifestyle and getting preventive care are important in promoting health and wellness.  Follow your health care provider's instructions about healthy  diet, exercising, and getting tested or screened for diseases.  Follow your health care provider's instructions on monitoring your cholesterol and blood pressure. This information is not intended to replace advice given to you by your health care provider. Make sure you discuss any questions you have with your health care provider. Document Revised: 08/02/2018 Document Reviewed: 08/02/2018 Elsevier Patient Education  2021 Elsevier Inc.  

## 2020-10-14 NOTE — Progress Notes (Signed)
   Becky King 1970-12-29 [id number]   History:  50 y.o. G3P0003 presents for annual exam with complaints of nipple discharge that she noticed 2 days ago. She found a black crust on her nipple but has not seen any active discharge and did not see a scab on nipple. Denies redness, swelling, pain, or lump. Postmenopausal, uses premarin for vaginal dryness. Says dryness has improved so she does not use it as often. Good improvement of hot flashes with Amberen. Normal pap and mammogram history. BTL. Smoker.   Gynecologic History No LMP recorded. Postmenopausal   Contraception/Family planning: post menopausal status and tubal ligation  Health Maintenance Last Pap: 02/2018. Results were: normal Last mammogram: 10/17/2019. Results were: normal Last colonoscopy: Never Last Dexa: N/A  Past medical history, past surgical history, family history and social history were all reviewed and documented in the EPIC chart.  ROS:  A ROS was performed and pertinent positives and negatives are included.  Exam:  Vitals:   10/14/20 1026  BP: 112/62  Pulse: 76  Weight: 180 lb (81.6 kg)  Height: 5' 2.75" (1.594 m)   Body mass index is 32.14 kg/m.  General appearance:  Normal Thyroid:  Symmetrical, normal in size, without palpable masses or nodularity. Respiratory  Auscultation:  Clear without wheezing or rhonchi Cardiovascular  Auscultation:  Regular rate, without rubs, murmurs or gallops  Edema/varicosities:  Not grossly evident Abdominal  Soft,nontender, without masses, guarding or rebound.  Liver/spleen:  No organomegaly noted  Hernia:  None appreciated  Skin  Inspection:  Grossly normal   Breasts: Examined lying and sitting.   Right: Without masses, retractions, discharge or axillary adenopathy.   Left: Without masses, retractions, discharge or axillary adenopathy. Gentitourinary   Inguinal/mons:  Normal without inguinal adenopathy  External genitalia:  Normal  BUS/Urethra/Skene's  glands:  Normal  Vagina:  Normal  Cervix:  Normal  Uterus:  Normal in size, shape and contour.  Midline and mobile  Adnexa/parametria:     Rt: Without masses or tenderness.   Lt: Without masses or tenderness.  Anus and perineum: Normal  Digital rectal exam: Normal sphincter tone without palpated masses or tenderness  Assessment/Plan:  50 y.o. G3P0003 for annual exam.   Well female exam with routine gynecological exam - Education provided on SBEs, importance of preventative screenings, current guidelines, high calcium diet, regular exercise, and multivitamin daily. Labs with PCP.   Nipple discharge - Plan: Prolactin. She found a black crust on her nipple 2 days ago but has not seen any active discharge and did not see a scab on nipple. Denies redness, swelling, pain, or lump. We will check prolactin today and refer for diagnostic ultrasound of right breast.   Postmenopausal - Hot flashes have improved on Amberen.   Vaginal dryness - Plan: conjugated estrogens (PREMARIN) vaginal cream. Has decreased use due to improvement.   Screening for cervical cancer - Normal Pap history.  Will repeat at 5-year interval per guidelines.  Screening for breast cancer - Normal mammogram history.  Continue annual screenings.  Normal breast exam today.  Screening for colon cancer - Scheduled for colonoscopy in April.  Follow up in 1 year for annual.     Becky King [facility], 10:39 AM 10/14/2020

## 2020-10-15 ENCOUNTER — Telehealth: Payer: Self-pay | Admitting: *Deleted

## 2020-10-15 DIAGNOSIS — N6452 Nipple discharge: Secondary | ICD-10-CM

## 2020-10-15 NOTE — Telephone Encounter (Signed)
-----   Message from [name], NP sent at 10/14/2020 11:18 AM EST ----- Please send referral for ultrasound of right breast for nipple discharge. She is due for annual screening mammogram as well. Thank you.

## 2020-10-15 NOTE — Telephone Encounter (Signed)
Patient scheduled at the breast center on 11/26/20@ 9:20am. Patient informed with time and date. Informed she can call daily/weekly to check for cancellations.

## 2020-11-21 ENCOUNTER — Other Ambulatory Visit: Payer: Self-pay

## 2020-11-21 ENCOUNTER — Ambulatory Visit (AMBULATORY_SURGERY_CENTER): Payer: Self-pay | Admitting: *Deleted

## 2020-11-21 VITALS — Ht 62.75 in | Wt 184.0 lb

## 2020-11-21 DIAGNOSIS — Z1211 Encounter for screening for malignant neoplasm of colon: Secondary | ICD-10-CM

## 2020-11-21 NOTE — Progress Notes (Signed)

## 2020-11-26 ENCOUNTER — Other Ambulatory Visit: Payer: Self-pay

## 2020-11-26 ENCOUNTER — Ambulatory Visit: Payer: BC Managed Care – PPO

## 2020-11-26 ENCOUNTER — Ambulatory Visit
Admission: RE | Admit: 2020-11-26 | Discharge: 2020-11-26 | Disposition: A | Discharge status: Home / Self Care | Payer: BC Managed Care – PPO | Source: Ambulatory Visit | Attending: Nurse Practitioner | Admitting: Nurse Practitioner

## 2020-11-26 DIAGNOSIS — N6452 Nipple discharge: Secondary | ICD-10-CM

## 2020-12-03 ENCOUNTER — Encounter: Payer: Self-pay | Admitting: Internal Medicine

## 2020-12-08 ENCOUNTER — Ambulatory Visit: Payer: BC Managed Care – PPO

## 2020-12-09 ENCOUNTER — Other Ambulatory Visit: Payer: Self-pay

## 2020-12-09 ENCOUNTER — Encounter: Payer: Self-pay | Admitting: Internal Medicine

## 2020-12-09 ENCOUNTER — Ambulatory Visit (AMBULATORY_SURGERY_CENTER): Payer: BC Managed Care – PPO | Admitting: Internal Medicine

## 2020-12-09 VITALS — BP 133/85 | HR 70 | Temp 97.1°F | Resp 15 | Ht 62.75 in | Wt 184.0 lb

## 2020-12-09 DIAGNOSIS — Z1211 Encounter for screening for malignant neoplasm of colon: Secondary | ICD-10-CM

## 2020-12-09 MED ORDER — SODIUM CHLORIDE 0.9 % IV SOLN: 500.0000 mL | Freq: Once | INTRAVENOUS | Status: DC

## 2020-12-09 NOTE — Progress Notes (Signed)
pt tolerated well. VSS. awake and to recovery. Report given to RN.  

## 2020-12-09 NOTE — Progress Notes (Signed)
VS-CW  Pt's states no medical or surgical changes since previsit or office visit.  

## 2020-12-09 NOTE — Op Note (Signed)
[facility] Patient Name: Becky King Procedure Date: 12/09/2020 7:19 AM MRN: [id number] Endoscopist: Iva Boop , MD Age: 50 Referring MD:  Date of Birth: Apr 30, 1971 Gender: Female Account #: [id number] Procedure:                Colonoscopy Indications:              Screening for colorectal malignant neoplasm, This                            is the patient's first colonoscopy Medicines:                Propofol per Anesthesia, Monitored Anesthesia Care Procedure:                Pre-Anesthesia Assessment:                           - Prior to the procedure, a History and Physical                            was performed, and patient medications and                            allergies were reviewed. The patient's tolerance of                            previous anesthesia was also reviewed. The risks                            and benefits of the procedure and the sedation                            options and risks were discussed with the patient.                            All questions were answered, and informed consent                            was obtained. Prior Anticoagulants: The patient has                            taken no previous anticoagulant or antiplatelet                            agents. ASA Grade Assessment: II - A patient with                            mild systemic disease. After reviewing the risks                            and benefits, the patient was deemed in                            satisfactory condition to undergo the procedure.  After obtaining informed consent, the colonoscope                            was passed under direct vision. Throughout the                            procedure, the patient's blood pressure, pulse, and                            oxygen saturations were monitored continuously. The                            Olympus PCF-[id number] (#[id number]) Colonoscope was                             introduced through the anus and advanced to the the                            cecum, identified by appendiceal orifice and                            ileocecal valve. The colonoscopy was performed                            without difficulty. The patient tolerated the                            procedure well. The quality of the bowel                            preparation was good. The ileocecal valve,                            appendiceal orifice, and rectum were photographed.                            The bowel preparation used was Miralax via split                            dose instruction. Scope In: 8:10:42 AM Scope Out: 8:25:33 AM Scope Withdrawal Time: 0 hours 11 minutes 50 seconds  Total Procedure Duration: 0 hours 14 minutes 51 seconds  Findings:                 Skin tags were found on perianal exam.                           The digital rectal exam was normal.                           Multiple diverticula were found in the sigmoid                            colon.  The exam was otherwise without abnormality on                            direct and retroflexion views. Complications:            No immediate complications. Estimated Blood Loss:     Estimated blood loss: none. Impression:               - Perianal skin tags found on perianal exam.                           - Moderate diverticulosis in the sigmoid colon.                           - The examination was otherwise normal on direct                            and retroflexion views.                           - No specimens collected. Recommendation:           - Patient has a contact number available for                            emergencies. The signs and symptoms of potential                            delayed complications were discussed with the                            patient. Return to normal activities tomorrow.                            Written discharge instructions were  provided to the                            patient.                           - Resume previous diet.                           - Continue present medications.                           - Repeat colonoscopy in 10 years for screening                            purposes.                           - CC Becky Beady, NP Iva Boop, MD 12/09/2020 8:35:23 AM This report has been signed electronically.

## 2020-12-09 NOTE — Patient Instructions (Addendum)
No polyps or cancer were seen.  You do have diverticulosis - thickened muscle rings and pouches in the colon wall. Please read the handout about this condition.  Next routine colonoscopy or other screening test in 10 years - 2032.  I appreciate the opportunity to care for you. Iva Boop, MD, [facility] Resume previous diet Continue current medications Repeat colonoscopy in 10 years YOU HAD AN ENDOSCOPIC PROCEDURE TODAY AT THE [facility]:   Refer to the procedure report that was given to you for any specific questions about what was found during the examination.  If the procedure report does not answer your questions, please call your gastroenterologist to clarify.  If you requested that your care partner not be given the details of your procedure findings, then the procedure report has been included in a sealed envelope for you to review at your convenience later.  YOU SHOULD EXPECT: Some feelings of bloating in the abdomen. Passage of more gas than usual.  Walking can help get rid of the air that was put into your GI tract during the procedure and reduce the bloating. If you had a lower endoscopy (such as a colonoscopy or flexible sigmoidoscopy) you may notice spotting of blood in your stool or on the toilet paper. If you underwent a bowel prep for your procedure, you may not have a normal bowel movement for a few days.  Please Note:  You might notice some irritation and congestion in your nose or some drainage.  This is from the oxygen used during your procedure.  There is no need for concern and it should clear up in a day or so.  SYMPTOMS TO REPORT IMMEDIATELY:   Following lower endoscopy (colonoscopy or flexible sigmoidoscopy):  Excessive amounts of blood in the stool  Significant tenderness or worsening of abdominal pains  Swelling of the abdomen that is new, acute  Fever of 100F or higher  For urgent or emergent issues, a gastroenterologist can be reached at any hour  by calling [phone]. Do not use MyChart messaging for urgent concerns.   DIET:  We do recommend a small meal at first, but then you may proceed to your regular diet.  Drink plenty of fluids but you should avoid alcoholic beverages for 24 hours.  ACTIVITY:  You should plan to take it easy for the rest of today and you should NOT DRIVE or use heavy machinery until tomorrow (because of the sedation medicines used during the test).    FOLLOW UP: Our staff will call the number listed on your records 48-72 hours following your procedure to check on you and address any questions or concerns that you may have regarding the information given to you following your procedure. If we do not reach you, we will leave a message.  We will attempt to reach you two times.  During this call, we will ask if you have developed any symptoms of COVID 19. If you develop any symptoms (ie: fever, flu-like symptoms, shortness of breath, cough etc.) before then, please call [phone].  If you test positive for Covid 19 in the 2 weeks post procedure, please call and report this information to [place].    If any biopsies were taken you will be contacted by phone or by letter within the next 1-3 weeks.  Please call us at [phone] if you have not heard about the biopsies in 3 weeks.   SIGNATURES/CONFIDENTIALITY: You and/or your care partner have signed paperwork which will be  entered into your electronic medical record.  These signatures attest to the fact that that the information above on your After Visit Summary has been reviewed and is understood.  Full responsibility of the confidentiality of this discharge information lies with you and/or your care-partner. 

## 2020-12-11 ENCOUNTER — Telehealth: Payer: Self-pay | Admitting: *Deleted

## 2020-12-11 NOTE — Telephone Encounter (Signed)
  Follow up Call-  Call back number 12/09/2020  Post procedure Call Back phone  # [phone]  Permission to leave phone message Yes  Some recent data might be hidden     Patient questions:  Do you have a fever, pain , or abdominal swelling? No. Pain Score  0 *  Have you tolerated food without any problems? Yes.  Have you been able to return to your normal activities? Yes.    Do you have any questions about your discharge instructions: Diet   No. Medications  No. Follow up visit  No.  Do you have questions or concerns about your Care? No.  Actions: * If pain score is 4 or above: No action needed, pain <4.  1. Have you developed a fever since your procedure? no  2.   Have you had an respiratory symptoms (SOB or cough) since your procedure? no  3.   Have you tested positive for COVID 19 since your procedure no  4.   Have you had any family members/close contacts diagnosed with the COVID 19 since your procedure?  no   If yes to any of these questions please route to Laverna Peace, RN and Karlton Lemon, RN

## 2021-10-14 IMAGING — MG DIGITAL DIAGNOSTIC BILAT W/ TOMO W/ CAD
8 series · 8 of 24 positions shown · non-contrast
Comparison: Previous exam(s).

CLINICAL DATA: The patient presented for right nipple discharge
workup. However, she is not actually seen or experience discharge.
Rather, she had a tiny scab on her nipple.

EXAM:
DIGITAL DIAGNOSTIC BILATERAL MAMMOGRAM WITH TOMOSYNTHESIS AND CAD
TECHNIQUE: Bilateral digital diagnostic mammography and breast tomosynthesis
was performed. The images were evaluated with computer-aided
detection.

[L MLO synth-2D]
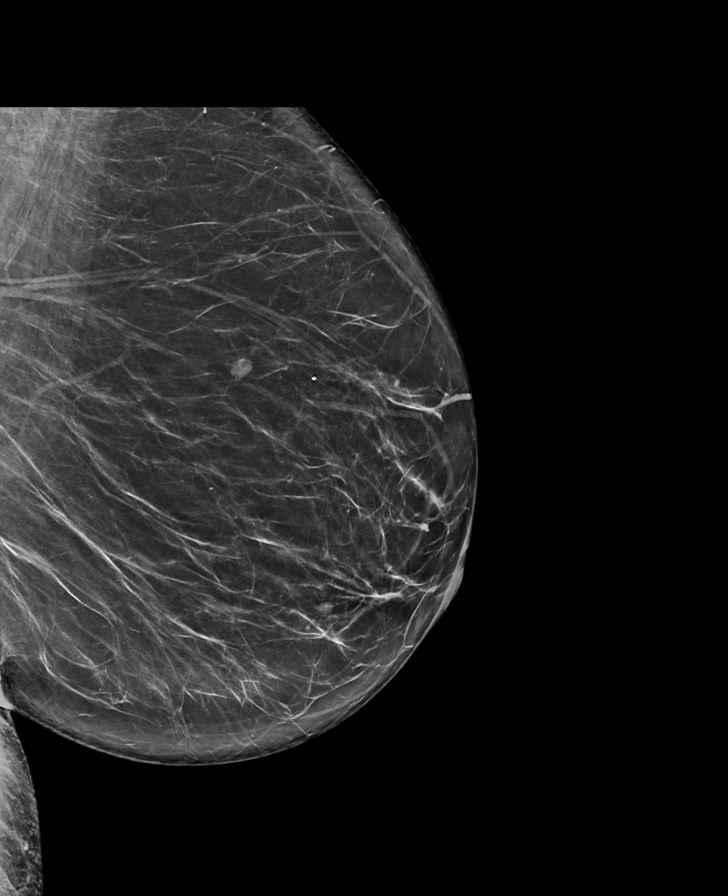

[L CC synth-2D]
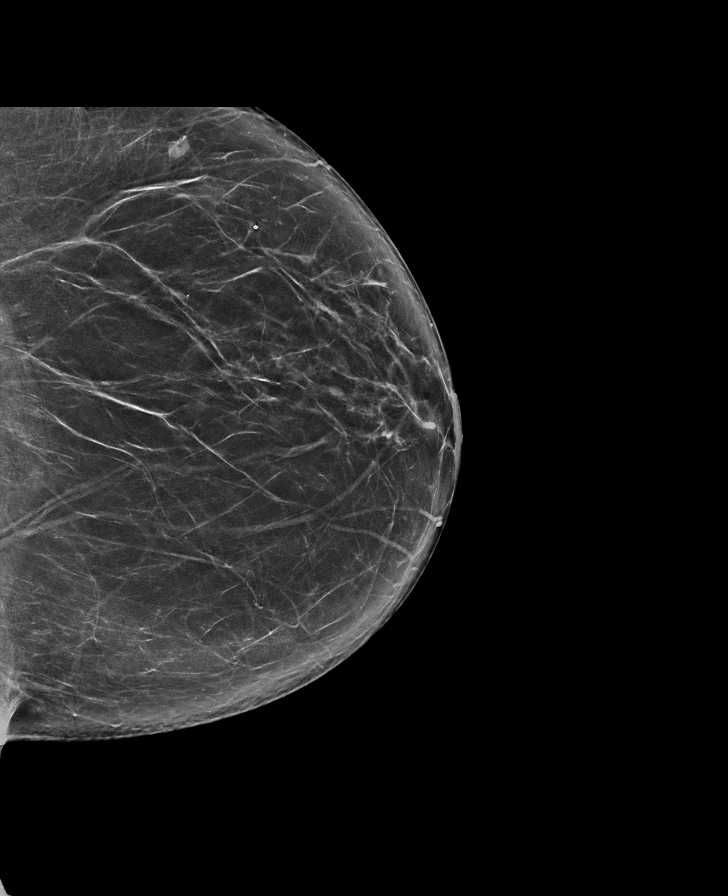

[R CC synth-2D]
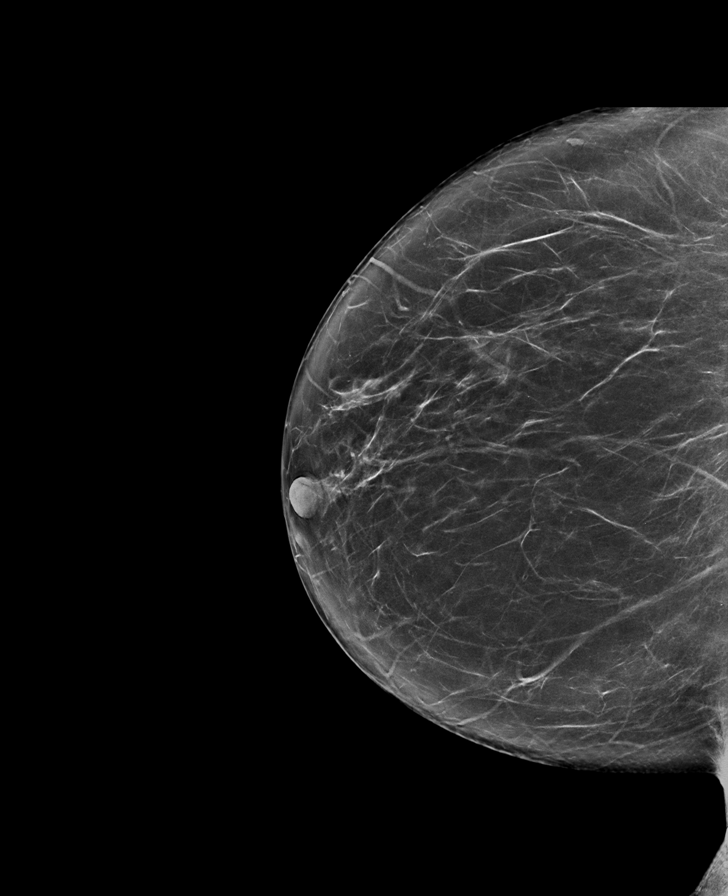

[R MLO synth-2D]
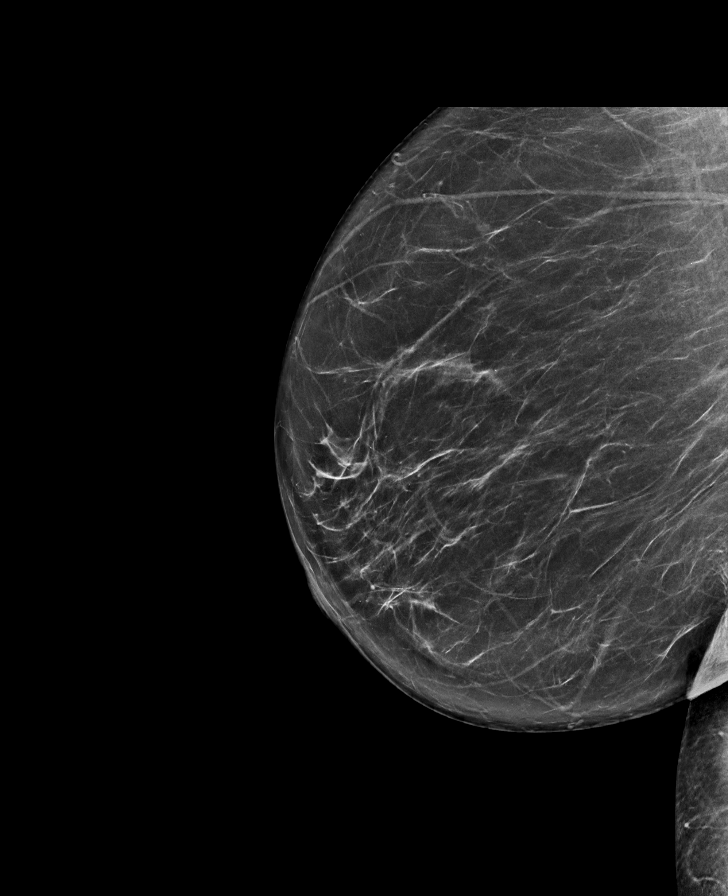

[R MLO tomo · tomo slice 41/80.0]
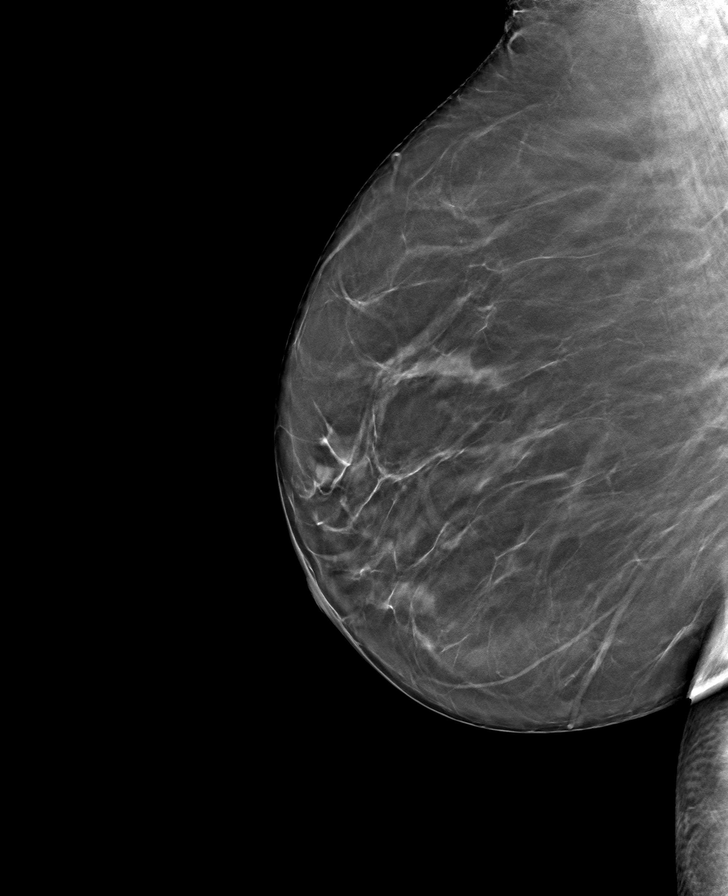

[L MLO tomo · tomo slice 41/82.0]
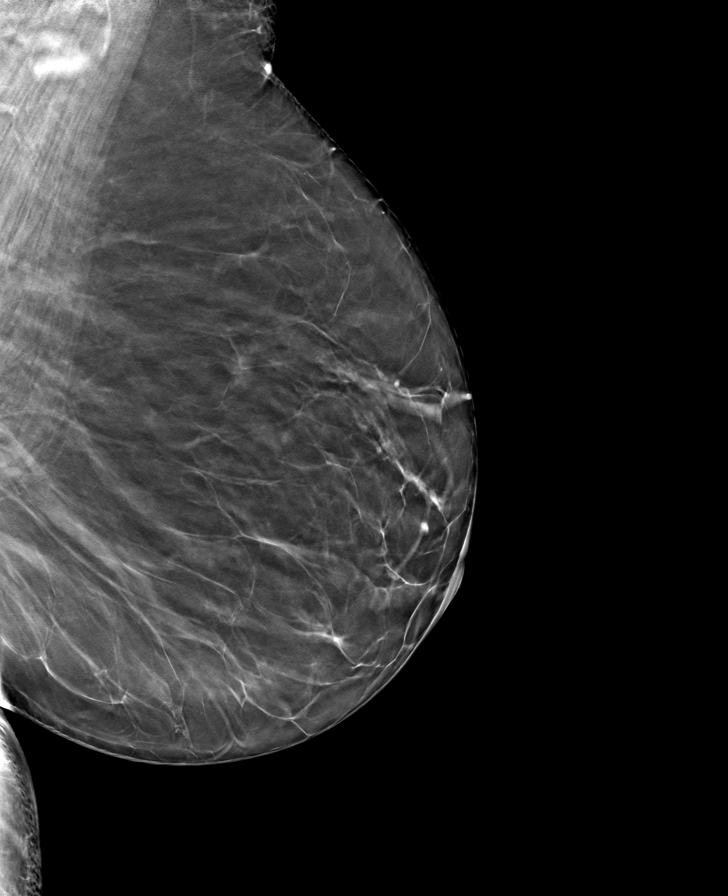

[L CC tomo · tomo slice 42/83.0]
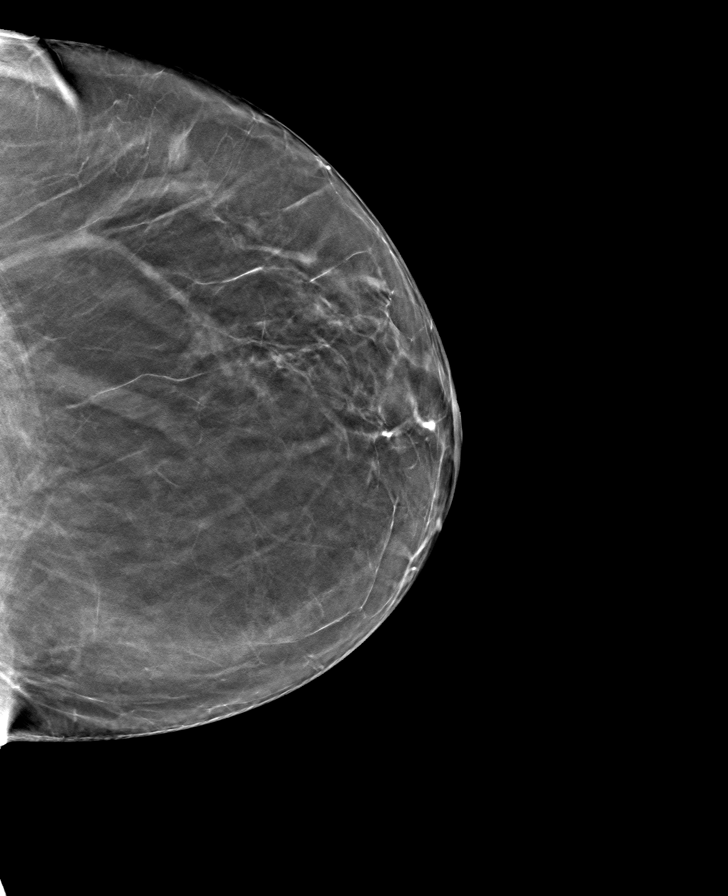

[R CC tomo · tomo slice 41/81.0]
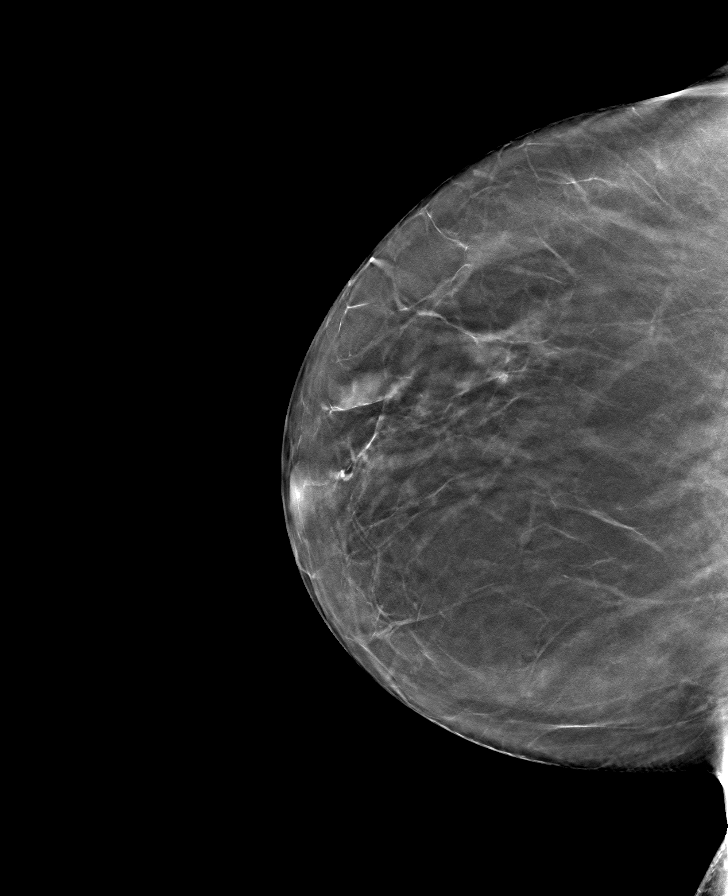

[8 of 24 positions shown; findings below may reference images not displayed]

ACR Breast Density Category b: There are scattered areas of
fibroglandular density.
FINDINGS: No suspicious masses, calcifications, or distortion are identified
in either breast.
IMPRESSION: No mammographic evidence of malignancy.

RECOMMENDATION:
Annual screening mammography. If the patient were to experience
definitive discharge, she should return for additional mammographic
and sonographic imaging.

I have discussed the findings and recommendations with the patient.
If applicable, a reminder letter will be sent to the patient
regarding the next appointment.

BI-RADS CATEGORY  1: Negative.

## 2021-10-15 ENCOUNTER — Encounter: Payer: Self-pay | Admitting: Nurse Practitioner

## 2021-10-15 ENCOUNTER — Ambulatory Visit (INDEPENDENT_AMBULATORY_CARE_PROVIDER_SITE_OTHER): Payer: BC Managed Care – PPO | Admitting: Nurse Practitioner

## 2021-10-15 ENCOUNTER — Other Ambulatory Visit: Payer: Self-pay

## 2021-10-15 VITALS — BP 110/70 | Ht 62.0 in | Wt 196.0 lb

## 2021-10-15 DIAGNOSIS — R5383 Other fatigue: Secondary | ICD-10-CM

## 2021-10-15 DIAGNOSIS — Z780 Asymptomatic menopausal state: Secondary | ICD-10-CM | POA: Diagnosis not present

## 2021-10-15 DIAGNOSIS — Z01419 Encounter for gynecological examination (general) (routine) without abnormal findings: Secondary | ICD-10-CM | POA: Diagnosis not present

## 2021-10-15 DIAGNOSIS — N951 Menopausal and female climacteric states: Secondary | ICD-10-CM

## 2021-10-15 DIAGNOSIS — E538 Deficiency of other specified B group vitamins: Secondary | ICD-10-CM

## 2021-10-15 LAB — CBC WITH DIFFERENTIAL/PLATELET
Absolute Monocytes: 515 cells/uL (ref 200–950)
Basophils Absolute: 31 cells/uL (ref 0–200)
Basophils Relative: 0.3 %
Eosinophils Absolute: 31 cells/uL (ref 15–500)
Eosinophils Relative: 0.3 %
HCT: 42.5 % (ref 35.0–45.0)
Hemoglobin: 14 g/dL (ref 11.7–15.5)
Lymphs Abs: 1761 cells/uL (ref 850–3900)
MCH: 28.6 pg (ref 27.0–33.0)
MCHC: 32.9 g/dL (ref 32.0–36.0)
MCV: 86.9 fL (ref 80.0–100.0)
MPV: 11.6 fL (ref 7.5–12.5)
Monocytes Relative: 5 %
Neutro Abs: 7962 cells/uL — ABNORMAL HIGH (ref 1500–7800)
Neutrophils Relative %: 77.3 %
Platelets: 402 10*3/uL — ABNORMAL HIGH (ref 140–400)
RBC: 4.89 10*6/uL (ref 3.80–5.10)
RDW: 12.5 % (ref 11.0–15.0)
Total Lymphocyte: 17.1 %
WBC: 10.3 10*3/uL (ref 3.8–10.8)

## 2021-10-15 LAB — COMPREHENSIVE METABOLIC PANEL
AG Ratio: 1.6 (calc) (ref 1.0–2.5)
ALT: 16 U/L (ref 6–29)
AST: 13 U/L (ref 10–35)
Albumin: 4.3 g/dL (ref 3.6–5.1)
Alkaline phosphatase (APISO): 51 U/L (ref 37–153)
BUN: 24 mg/dL (ref 7–25)
CO2: 27 mmol/L (ref 20–32)
Calcium: 9.3 mg/dL (ref 8.6–10.4)
Chloride: 107 mmol/L (ref 98–110)
Creat: 0.52 mg/dL (ref 0.50–1.03)
Globulin: 2.7 g/dL (calc) (ref 1.9–3.7)
Glucose, Bld: 106 mg/dL — ABNORMAL HIGH (ref 65–99)
Potassium: 4.4 mmol/L (ref 3.5–5.3)
Sodium: 142 mmol/L (ref 135–146)
Total Bilirubin: 0.4 mg/dL (ref 0.2–1.2)
Total Protein: 7 g/dL (ref 6.1–8.1)

## 2021-10-15 LAB — LIPID PANEL
Cholesterol: 190 mg/dL (ref <200)
HDL: 47 mg/dL — ABNORMAL LOW (ref >=50)
LDL Cholesterol (Calc): 116 mg/dL (calc) — ABNORMAL HIGH
Non-HDL Cholesterol (Calc): 143 mg/dL (calc) — ABNORMAL HIGH (ref <130)
Total CHOL/HDL Ratio: 4 (calc) (ref <5.0)
Triglycerides: 154 mg/dL — ABNORMAL HIGH (ref <150)

## 2021-10-15 LAB — TSH: TSH: 1.13 mIU/L

## 2021-10-15 LAB — VITAMIN D 25 HYDROXY (VIT D DEFICIENCY, FRACTURES): Vit D, 25-Hydroxy: 20 ng/mL — ABNORMAL LOW (ref 30–100)

## 2021-10-15 LAB — VITAMIN B12: Vitamin B-12: 670 pg/mL (ref 200–1100)

## 2021-10-15 MED ORDER — PREMARIN 0.625 MG/GM VA CREA: TOPICAL_CREAM | VAGINAL | 0 refills | Status: DC

## 2021-10-15 NOTE — Progress Notes (Signed)
Becky King Jun 07, 1971 [id number]   History:  51 y.o. G3P0003 presents for annual exam. Postmenopausal. Uses premarin occasionally for vaginal dryness. She feels it has improved significantly. Has also had improvement in hot flashes. She complains of daytime fatigue. History of B12 deficiency, taking 500 mcg daily, received injections in the past. Reports restful sleep, about 6-7 hours per night which is normal for her. Fatigue can occur at any point during the day and she feels she could just fall asleep. 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 Has had weight gain. Normal pap and mammogram history. Smoker, has decreased to 2-3 per day.   Gynecologic History No LMP recorded. Postmenopausal   Contraception/Family planning: post menopausal status and tubal ligation Sexually active: Yes  Health Maintenance Last Pap: 03/14/2018. Results were: Normal, 5-year repeat Last mammogram: 11/26/2020. Results were: Normal Last colonoscopy: 12/09/2020. Results were: Normal, 10-year recall Last Dexa: Not indicated  Past medical history, past surgical history, family history and social history were all reviewed and documented in the EPIC chart. Divorced. Working remote for [employer]. 3 children - 38 yo son, 66 yo daughter, son in his 30s.   ROS:  A ROS was performed and pertinent positives and negatives are included.  Exam:  Vitals:   10/15/21 1140  BP: 110/70  Weight: 196 lb (88.9 kg)  Height: 5\' 2"  (1.575 m)    Body mass index is 35.85 kg/m.  General appearance:  Normal Thyroid:  Symmetrical, normal in size, without palpable masses or nodularity. Respiratory  Auscultation:  Clear without wheezing or rhonchi Cardiovascular  Auscultation:  Regular rate, without rubs, murmurs or gallops  Edema/varicosities:  Not grossly evident Abdominal  Soft,nontender, without masses, guarding or rebound.  Liver/spleen:  No organomegaly noted  Hernia:  None appreciated  Skin  Inspection:   Grossly normal   Breasts: Examined lying and sitting.   Right: Without masses, retractions, discharge or axillary adenopathy.   Left: Without masses, retractions, discharge or axillary adenopathy. Genitourinary   Inguinal/mons:  Normal without inguinal adenopathy  External genitalia:  Normal appearing vulva with no masses, tenderness, or lesions  BUS/Urethra/Skene's glands:  Normal  Vagina:  Normal appearing with normal color and discharge, no lesions  Cervix:  Normal appearing without discharge or lesions  Uterus:  Normal in size, shape and contour.  Midline and mobile, nontender  Adnexa/parametria:     Rt: Normal in size, without masses or tenderness.   Lt: Normal in size, without masses or tenderness.  Anus and perineum: Normal  Digital rectal exam: Normal sphincter tone without palpated masses or tenderness  Patient informed chaperone available to be present for breast and pelvic exam. Patient has requested no chaperone to be present. Patient has been advised what will be completed during breast and pelvic exam.   Assessment/Plan:  51 y.o. G3P0003 for annual exam.   Well female exam with routine gynecological exam - Plan: CBC with Differential/Platelet, Comprehensive metabolic panel, Lipid panel, TSH. Education provided on SBEs, importance of preventative screenings, current guidelines, high calcium diet, regular exercise, and multivitamin daily.   Postmenopausal - no HRT, no bleeding.   Menopausal vaginal dryness - Plan: conjugated estrogens (PREMARIN) vaginal cream. She has not been using as consistently due to improvement in symptoms. She is asking for refill.   Fatigue, unspecified type - Plan: CBC with Differential/Platelet, Comprehensive metabolic panel, TSH, Vitamin B12, VITAMIN D 25 Hydroxy (Vit-D Deficiency, Fractures). She complains of daytime fatigue. History of B12 deficiency, taking 500 mcg  daily, received injections in the past. Reports restful sleep, about 6-7 hours  per night which is normal for her. Fatigue can occur at any point during the day and she feels she could just fall asleep. 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 Has had weight gain. Will check labs but also discussed how weight gain and lack of physical activity can affect energy.   B12 deficiency - Plan: Vitamin B12. Taking 500 mcg daily, received injections in the past.   Screening for cervical cancer - Normal Pap history.  Will repeat at 5-year interval per guidelines.  Screening for breast cancer - Normal mammogram history.  Continue annual screenings.  Normal breast exam today.  Screening for colon cancer - April 2022 colonoscopy. Will repeat at 10-year interval per GI's recommendation.   Follow up in 1 year for annual.     [name] [facility], 12:16 PM 10/15/2021

## 2021-10-16 ENCOUNTER — Telehealth: Payer: Self-pay | Admitting: *Deleted

## 2021-10-16 NOTE — Telephone Encounter (Signed)
PA done online via over my med for premarin 0.625 mg cream, waiting response from [facility].

## 2021-10-19 ENCOUNTER — Other Ambulatory Visit: Payer: Self-pay | Admitting: Nurse Practitioner

## 2021-10-19 DIAGNOSIS — N951 Menopausal and female climacteric states: Secondary | ICD-10-CM

## 2021-10-19 DIAGNOSIS — E559 Vitamin D deficiency, unspecified: Secondary | ICD-10-CM

## 2021-10-19 MED ORDER — VITAMIN D (ERGOCALCIFEROL) 1.25 MG (50000 UNIT) PO CAPS: 50000.0000 [IU] | ORAL_CAPSULE | ORAL | 0 refills | Status: AC

## 2021-10-19 MED ORDER — ESTRADIOL 0.1 MG/GM VA CREA: 1.0000 g | TOPICAL_CREAM | VAGINAL | 1 refills | Status: DC

## 2021-10-19 NOTE — Telephone Encounter (Signed)
Left message for patient

## 2021-10-19 NOTE — Telephone Encounter (Signed)
Please provide her with her options and I am happy to provide any of them. If she likes the cream we can switch to the estradiol. If she prefers to switch to a vaginal tablet we can try Imvexxy or Vagifem.

## 2021-10-19 NOTE — Telephone Encounter (Signed)
Prescription sent with instructions to use 1 gram vaginally twice a week at night. She does not need to repeat lab work. It was just slightly elevated and to be expected if she was not fasting.

## 2021-10-19 NOTE — Telephone Encounter (Signed)
[facility] denied patient will need to try and fail estradiol vaginal cream, Imvexxy, Vagifem. Before premarin vaginal cream can be approved. Please advise

## 2021-10-19 NOTE — Telephone Encounter (Signed)
Patient said she would prefer the estradiol vaginal cream sent to [facility].   She saw her glucose level was at 106, patient said she was not fasting asked if this should be repeated?   Please advise

## 2021-10-19 NOTE — Telephone Encounter (Signed)
Patient informed. 

## 2022-02-25 ENCOUNTER — Telehealth: Payer: Self-pay

## 2022-02-25 ENCOUNTER — Other Ambulatory Visit: Payer: Self-pay | Admitting: Nurse Practitioner

## 2022-02-25 DIAGNOSIS — E559 Vitamin D deficiency, unspecified: Secondary | ICD-10-CM

## 2022-02-25 NOTE — Telephone Encounter (Signed)
Patient called for the dose of Vitamin D3 that she should be taking daily after finishing the Rx dose.  Advised 2000 iu's daily per Tiffany's result note.

## 2022-10-18 ENCOUNTER — Other Ambulatory Visit (HOSPITAL_COMMUNITY)
Admission: RE | Admit: 2022-10-18 | Discharge: 2022-10-18 | Disposition: A | Discharge status: Home / Self Care | Payer: BC Managed Care – PPO | Source: Ambulatory Visit | Attending: Nurse Practitioner | Admitting: Nurse Practitioner

## 2022-10-18 ENCOUNTER — Ambulatory Visit (INDEPENDENT_AMBULATORY_CARE_PROVIDER_SITE_OTHER): Payer: BC Managed Care – PPO | Admitting: Nurse Practitioner

## 2022-10-18 VITALS — BP 136/80 | HR 93 | Ht 62.21 in | Wt 203.0 lb

## 2022-10-18 DIAGNOSIS — E7800 Pure hypercholesterolemia, unspecified: Secondary | ICD-10-CM

## 2022-10-18 DIAGNOSIS — Z01419 Encounter for gynecological examination (general) (routine) without abnormal findings: Secondary | ICD-10-CM

## 2022-10-18 DIAGNOSIS — Z124 Encounter for screening for malignant neoplasm of cervix: Secondary | ICD-10-CM | POA: Diagnosis present

## 2022-10-18 DIAGNOSIS — E559 Vitamin D deficiency, unspecified: Secondary | ICD-10-CM

## 2022-10-18 DIAGNOSIS — N951 Menopausal and female climacteric states: Secondary | ICD-10-CM | POA: Diagnosis not present

## 2022-10-18 MED ORDER — ESTRADIOL 0.1 MG/GM VA CREA: 1.0000 g | TOPICAL_CREAM | VAGINAL | 1 refills | Status: AC

## 2022-10-18 NOTE — Progress Notes (Signed)
   Becky King September 06, 1970 [id number]   History:  52 y.o. G3P0003 presents for annual exam. Postmenopausal. Using vaginal estrogen for dryness. She feels it has improved significantly but is not consistent with use. Normal pap and mammogram history. Smoker, ~2-3 per day.   Gynecologic History No LMP recorded. Postmenopausal   Contraception/Family planning: post menopausal status and tubal ligation Sexually active: Yes  Health Maintenance Last Pap: 03/14/2018. Results were: Normal neg HPV, 5-year repeat Last mammogram: 11/26/2020. Results were: Normal Last colonoscopy: 12/09/2020. Results were: Normal, 10-year recall Last Dexa: Not indicated  Past medical history, past surgical history, family history and social history were all reviewed and documented in the EPIC chart. Divorced. Working remote for [employer]. 3 children - 61 yo son, son and daughter in their 23s.   ROS:  A ROS was performed and pertinent positives and negatives are included.  Exam:  Vitals:   10/18/22 1150  BP: 136/80  Pulse: 93  SpO2: 98%  Weight: 203 lb (92.1 kg)  Height: 5' 2.21" (1.58 m)    Body mass index is 36.88 kg/m.  General appearance:  Normal Thyroid:  Symmetrical, normal in size, without palpable masses or nodularity. Respiratory  Auscultation:  Clear without wheezing or rhonchi Cardiovascular  Auscultation:  Regular rate, without rubs, murmurs or gallops  Edema/varicosities:  Not grossly evident Abdominal  Soft,nontender, without masses, guarding or rebound.  Liver/spleen:  No organomegaly noted  Hernia:  None appreciated  Skin  Inspection:  Grossly normal   Breasts: Examined lying and sitting.   Right: Without masses, retractions, discharge or axillary adenopathy.   Left: Without masses, retractions, discharge or axillary adenopathy. Genitourinary   Inguinal/mons:  Normal without inguinal adenopathy  External genitalia:  Normal appearing vulva with no masses, tenderness, or  lesions  BUS/Urethra/Skene's glands:  Normal  Vagina:  Normal appearing with normal color and discharge, no lesions  Cervix:  Normal appearing without discharge or lesions  Uterus:  Normal in size, shape and contour.  Midline and mobile, nontender  Adnexa/parametria:     Rt: Normal in size, without masses or tenderness.   Lt: Normal in size, without masses or tenderness.  Anus and perineum: Normal  Digital rectal exam: Deferred  Patient informed chaperone available to be present for breast and pelvic exam. Patient has requested no chaperone to be present. Patient has been advised what will be completed during breast and pelvic exam.   Assessment/Plan:  52 y.o. G3P0003 for annual exam.   Well female exam with routine gynecological exam - Plan: CBC with Differential/Platelet, Comprehensive metabolic panel. Education provided on SBEs, importance of preventative screenings, current guidelines, high calcium diet, regular exercise, and multivitamin daily.   Postmenopausal - no HRT, no bleeding.   Screening for cervical cancer - Plan: Cytology - PAP( [place]). Normal pap history.   Menopausal vaginal dryness - Plan: estradiol (ESTRACE VAGINAL) 0.1 MG/GM vaginal cream twice weekly. Recommend using consistently for best results.   Vitamin D deficiency - Plan: VITAMIN D 25 Hydroxy (Vit-D Deficiency, Fractures)  Elevated LDL cholesterol level - Plan: Lipid panel  Screening for breast cancer - Normal mammogram history.  Overdue and plans to schedule soon. Normal breast exam today.  Screening for colon cancer - April 2022 colonoscopy. Will repeat at 10-year interval per GI's recommendation.   Follow up in 1 year for annual.     Tamela Gammon [facility], 11:58 AM 10/18/2022

## 2022-10-19 ENCOUNTER — Other Ambulatory Visit: Payer: Self-pay | Admitting: Nurse Practitioner

## 2022-10-19 DIAGNOSIS — B9689 Other specified bacterial agents as the cause of diseases classified elsewhere: Secondary | ICD-10-CM

## 2022-10-19 LAB — CBC WITH DIFFERENTIAL/PLATELET
Absolute Monocytes: 540 cells/uL (ref 200–950)
Basophils Absolute: 38 cells/uL (ref 0–200)
Basophils Relative: 0.5 %
Eosinophils Absolute: 150 cells/uL (ref 15–500)
Eosinophils Relative: 2 %
HCT: 41.7 % (ref 35.0–45.0)
Hemoglobin: 13.7 g/dL (ref 11.7–15.5)
Lymphs Abs: 2078 cells/uL (ref 850–3900)
MCH: 28 pg (ref 27.0–33.0)
MCHC: 32.9 g/dL (ref 32.0–36.0)
MCV: 85.1 fL (ref 80.0–100.0)
MPV: 12 fL (ref 7.5–12.5)
Monocytes Relative: 7.2 %
Neutro Abs: 4695 cells/uL (ref 1500–7800)
Neutrophils Relative %: 62.6 %
Platelets: 335 10*3/uL (ref 140–400)
RBC: 4.9 10*6/uL (ref 3.80–5.10)
RDW: 13 % (ref 11.0–15.0)
Total Lymphocyte: 27.7 %
WBC: 7.5 10*3/uL (ref 3.8–10.8)

## 2022-10-19 LAB — LIPID PANEL
Cholesterol: 174 mg/dL (ref <200)
HDL: 42 mg/dL — ABNORMAL LOW (ref >=50)
LDL Cholesterol (Calc): 99 mg/dL (calc)
Non-HDL Cholesterol (Calc): 132 mg/dL (calc) — ABNORMAL HIGH (ref <130)
Total CHOL/HDL Ratio: 4.1 (calc) (ref <5.0)
Triglycerides: 212 mg/dL — ABNORMAL HIGH (ref <150)

## 2022-10-19 LAB — COMPREHENSIVE METABOLIC PANEL
AG Ratio: 1.5 (calc) (ref 1.0–2.5)
ALT: 17 U/L (ref 6–29)
AST: 17 U/L (ref 10–35)
Albumin: 4.1 g/dL (ref 3.6–5.1)
Alkaline phosphatase (APISO): 57 U/L (ref 37–153)
BUN: 15 mg/dL (ref 7–25)
CO2: 25 mmol/L (ref 20–32)
Calcium: 9 mg/dL (ref 8.6–10.4)
Chloride: 108 mmol/L (ref 98–110)
Creat: 0.6 mg/dL (ref 0.50–1.03)
Globulin: 2.8 g/dL (calc) (ref 1.9–3.7)
Glucose, Bld: 90 mg/dL (ref 65–99)
Potassium: 4.6 mmol/L (ref 3.5–5.3)
Sodium: 143 mmol/L (ref 135–146)
Total Bilirubin: 0.4 mg/dL (ref 0.2–1.2)
Total Protein: 6.9 g/dL (ref 6.1–8.1)

## 2022-10-19 LAB — VITAMIN D 25 HYDROXY (VIT D DEFICIENCY, FRACTURES): Vit D, 25-Hydroxy: 52 ng/mL (ref 30–100)

## 2022-10-19 LAB — CYTOLOGY - PAP
Comment: NEGATIVE
Diagnosis: NEGATIVE
High risk HPV: NEGATIVE

## 2022-10-19 MED ORDER — METRONIDAZOLE 500 MG PO TABS: 500.0000 mg | ORAL_TABLET | Freq: Two times a day (BID) | ORAL | 0 refills | Status: DC

## 2023-08-24 DIAGNOSIS — K851 Biliary acute pancreatitis without necrosis or infection: Secondary | ICD-10-CM

## 2023-08-24 HISTORY — DX: Biliary acute pancreatitis without necrosis or infection: K85.10

## 2024-01-12 ENCOUNTER — Emergency Department (HOSPITAL_BASED_OUTPATIENT_CLINIC_OR_DEPARTMENT_OTHER)

## 2024-01-12 ENCOUNTER — Other Ambulatory Visit: Payer: Self-pay

## 2024-01-12 ENCOUNTER — Inpatient Hospital Stay (HOSPITAL_BASED_OUTPATIENT_CLINIC_OR_DEPARTMENT_OTHER)
Admission: EM | Admit: 2024-01-12 | Discharge: 2024-01-15 | DRG: 417 | Disposition: A | Discharge status: Home / Self Care | Source: Other Acute Inpatient Hospital | Attending: Family Medicine | Admitting: Family Medicine

## 2024-01-12 DIAGNOSIS — K810 Acute cholecystitis: Secondary | ICD-10-CM | POA: Diagnosis not present

## 2024-01-12 DIAGNOSIS — E785 Hyperlipidemia, unspecified: Secondary | ICD-10-CM | POA: Diagnosis present

## 2024-01-12 DIAGNOSIS — Z91040 Latex allergy status: Secondary | ICD-10-CM | POA: Diagnosis not present

## 2024-01-12 DIAGNOSIS — K851 Biliary acute pancreatitis without necrosis or infection: Secondary | ICD-10-CM | POA: Diagnosis present

## 2024-01-12 DIAGNOSIS — F17210 Nicotine dependence, cigarettes, uncomplicated: Secondary | ICD-10-CM | POA: Diagnosis present

## 2024-01-12 DIAGNOSIS — E538 Deficiency of other specified B group vitamins: Secondary | ICD-10-CM | POA: Diagnosis present

## 2024-01-12 DIAGNOSIS — Z6835 Body mass index (BMI) 35.0-35.9, adult: Secondary | ICD-10-CM

## 2024-01-12 DIAGNOSIS — J45909 Unspecified asthma, uncomplicated: Secondary | ICD-10-CM | POA: Diagnosis present

## 2024-01-12 DIAGNOSIS — E66812 Obesity, class 2: Secondary | ICD-10-CM | POA: Diagnosis present

## 2024-01-12 DIAGNOSIS — K8062 Calculus of gallbladder and bile duct with acute cholecystitis without obstruction: Principal | ICD-10-CM | POA: Diagnosis present

## 2024-01-12 DIAGNOSIS — Z716 Tobacco abuse counseling: Secondary | ICD-10-CM | POA: Diagnosis not present

## 2024-01-12 DIAGNOSIS — K838 Other specified diseases of biliary tract: Secondary | ICD-10-CM | POA: Diagnosis not present

## 2024-01-12 DIAGNOSIS — R7989 Other specified abnormal findings of blood chemistry: Secondary | ICD-10-CM | POA: Diagnosis not present

## 2024-01-12 DIAGNOSIS — R932 Abnormal findings on diagnostic imaging of liver and biliary tract: Secondary | ICD-10-CM | POA: Diagnosis not present

## 2024-01-12 LAB — CBC
HCT: 45.5 % (ref 36.0–46.0)
Hemoglobin: 14.9 g/dL (ref 12.0–15.0)
MCH: 29.2 pg (ref 26.0–34.0)
MCHC: 32.7 g/dL (ref 30.0–36.0)
MCV: 89.2 fL (ref 80.0–100.0)
Platelets: 331 10*3/uL (ref 150–400)
RBC: 5.1 MIL/uL (ref 3.87–5.11)
RDW: 12.4 % (ref 11.5–15.5)
WBC: 12.9 10*3/uL — ABNORMAL HIGH (ref 4.0–10.5)
nRBC: 0 % (ref 0.0–0.2)

## 2024-01-12 LAB — COMPREHENSIVE METABOLIC PANEL WITH GFR
ALT: 181 U/L — ABNORMAL HIGH (ref 0–44)
AST: 209 U/L — ABNORMAL HIGH (ref 15–41)
Albumin: 4.6 g/dL (ref 3.5–5.0)
Alkaline Phosphatase: 189 U/L — ABNORMAL HIGH (ref 38–126)
Anion gap: 13 (ref 5–15)
BUN: 16 mg/dL (ref 6–20)
CO2: 24 mmol/L (ref 22–32)
Calcium: 10.1 mg/dL (ref 8.9–10.3)
Chloride: 103 mmol/L (ref 98–111)
Creatinine, Ser: 0.6 mg/dL (ref 0.44–1.00)
GFR, Estimated: >60 mL/min (ref >60)
Glucose, Bld: 118 mg/dL — ABNORMAL HIGH (ref 70–99)
Potassium: 3.9 mmol/L (ref 3.5–5.1)
Sodium: 140 mmol/L (ref 135–145)
Total Bilirubin: 0.7 mg/dL (ref 0.0–1.2)
Total Protein: 8 g/dL (ref 6.5–8.1)

## 2024-01-12 LAB — URINALYSIS, ROUTINE W REFLEX MICROSCOPIC
Bilirubin Urine: NEGATIVE
Glucose, UA: NEGATIVE mg/dL
Hgb urine dipstick: NEGATIVE
Ketones, ur: NEGATIVE mg/dL
Leukocytes,Ua: NEGATIVE
Nitrite: NEGATIVE
Protein, ur: NEGATIVE mg/dL
Specific Gravity, Urine: 1.024 (ref 1.005–1.030)
pH: 7 (ref 5.0–8.0)

## 2024-01-12 LAB — PREGNANCY, URINE: Preg Test, Ur: NEGATIVE

## 2024-01-12 LAB — LIPASE, BLOOD: Lipase: 2669 U/L — ABNORMAL HIGH (ref 11–51)

## 2024-01-12 MED ORDER — ONDANSETRON HCL 4 MG/2ML IJ SOLN
4.0000 mg | Freq: Once | INTRAMUSCULAR | Status: DC
  Filled 2024-01-12: qty 2

## 2024-01-12 MED ORDER — PIPERACILLIN-TAZOBACTAM 3.375 G IVPB 30 MIN
3.3750 g | Freq: Once | INTRAVENOUS | Status: AC
  Administered 2024-01-12: 3.375 g via INTRAVENOUS
  Filled 2024-01-12: qty 50

## 2024-01-12 MED ORDER — HYDROMORPHONE HCL 1 MG/ML IJ SOLN: 0.5000 mg | INTRAMUSCULAR | Status: DC | PRN

## 2024-01-12 MED ORDER — LACTATED RINGERS IV SOLN
INTRAVENOUS | Status: AC
Start: 2024-01-12 — End: 2024-01-13

## 2024-01-12 MED ORDER — HYDROMORPHONE HCL 1 MG/ML IJ SOLN
1.0000 mg | Freq: Once | INTRAMUSCULAR | Status: DC
  Filled 2024-01-12: qty 1

## 2024-01-12 MED ORDER — LACTATED RINGERS IV BOLUS
1000.0000 mL | INTRAVENOUS | Status: AC
  Administered 2024-01-12: 1000 mL via INTRAVENOUS

## 2024-01-12 NOTE — ED Triage Notes (Signed)
 Pt OV reporting RUQ abd pain since Saturday, seen by UC Monday, told liver enzymes were elevated, told to come to ED if pain persists.

## 2024-01-12 NOTE — ED Notes (Signed)
 Patient states that she wants to wait to take pain and nausea medication until it "flares back up" and reports her current pain level at a 5. Medication will be returned and administered upon patient request.

## 2024-01-12 NOTE — Hospital Course (Signed)
 [facility] ED to [facility] transfer: 53 year old female present emergency department complaining of epigastric abdominal pain with associate nausea vomiting.The patient has had intermittent episodes of severe pain in her epigastrium for the past year. It is now been persistent since Saturday of this week. She reports it is worsened with any oral intake. She states the pain is in the right upper and left upper quadrant as well as epigastrium. She has had multiple episodes of vomiting. She has a past medical history of hernia repair, salpingectomy and appendectomy. She denies alcohol abuse. She has had intermittent episodes of dark urine since this pain began.  At presentation to ED patient found bradycardic heart rate 58 otherwise hemodynamically stable. Found to have elevated lipase 2600. CMP showing elevated AST/ALT/ALP 209/181/189.  Normal bilirubin level. CBC showing leukocytosis 12.9.  negative pregnancy test. EKG showing normal sinus rhythm heart rate 88.  Right upper quadrant ultrasound showed cholelithiasis with gallbladder thickening.  Sonographic Murphy sign negative.  Finding equivocal for acute cholecystitis.  Common bile duct dilated 6.5 mm.  Multiple hepatic cysts.  In the ED patient has been treated with Dilaudid, Zofran and Zosyn.  Hospitalist has been consulted for further management of acute pancreatitis, concern for development of choledocholithiasis and acute cholecystitis.   As patient used to see [facility] in the past ED physician paged and sent secure chat message to [facility] Dr. Elvin Hammer.  Pending response by GI.

## 2024-01-12 NOTE — ED Provider Notes (Signed)
 [facility]GENC[facility] DEPARTMENT AT [facility] Provider Note   CSN: [id number] Arrival date & time: 01/12/24  1721     History  Chief Complaint  Patient presents with   Abdominal Pain    Becky King is a 53 y.o. female who presents emergency department for epigastric abdominal pain nausea and vomiting.  The patient has had intermittent episodes of severe pain in her epigastrium for the past year.  It is now been persistent since Saturday of this week.  She reports it is worsened with any oral intake.  She states the pain is in the right upper and left upper quadrant as well as epigastrium.  She has had multiple episodes of vomiting.  She has a past medical history of hernia repair, salpingectomy and appendectomy.  She denies alcohol abuse.  She has had intermittent episodes of dark urine since this pain began   Abdominal Pain      Home Medications Prior to Admission medications   Medication Sig Start Date End Date Taking? Authorizing Provider  acetaminophen  (TYLENOL ) 500 MG tablet Take 500 mg by mouth every 6 (six) hours as needed for moderate pain.    [provider]  Acetaminophen -Caff-Pyrilamine (MIDOL  COMPLETE PO) Take by mouth.    [provider]  albuterol  (VENTOLIN  HFA) 108 (90 Base) MCG/ACT inhaler Inhale into the lungs every 6 (six) hours as needed for wheezing or shortness of breath.    [provider]  Cyanocobalamin  (VITAMIN B 12 PO) Take by mouth.    [provider]  estradiol  (ESTRACE  VAGINAL) 0.1 MG/GM vaginal cream Place 1 g vaginally 2 (two) times a week. 10/18/22   Andee Bamberger, NP  fexofenadine (ALLEGRA) 180 MG tablet Take 180 mg by mouth daily.    [provider]  fluticasone  (FLONASE ) 50 MCG/ACT nasal spray Place 1 spray into both nostrils daily. Patient taking differently: Place 1 spray into both nostrils daily as needed for allergies. 12/17/16   Layden, Lindsey A, PA-C  metroNIDAZOLE  (FLAGYL )  500 MG tablet Take 1 tablet (500 mg total) by mouth 2 (two) times daily. 10/19/22   Andee Bamberger, NP  OVER THE COUNTER MEDICATION Apply 1 application topically 2 (two) times daily. Psoriasis lotion    [provider]  polyvinyl alcohol (LIQUIFILM TEARS) 1.4 % ophthalmic solution Place 1 drop into both eyes as needed. [employer] For dry eyes    [provider]  Rhubarb (ESTROVEN COMPLETE PO) Take by mouth.    [provider]      Allergies    Latex    Review of Systems   Review of Systems  Gastrointestinal:  Positive for abdominal pain.    Physical Exam Updated Vital Signs BP 135/78 (BP Location: Right Arm)   Pulse 91   Temp 97.7 F (36.5 C) (Oral)   Resp 16   Ht 5\' 2"  (1.575 m)   Wt 87.1 kg   SpO2 97%   BMI 35.12 kg/m  Physical Exam Vitals and nursing note reviewed.  Constitutional:      General: She is not in acute distress.    Appearance: She is well-developed. She is not diaphoretic.  HENT:     Head: Normocephalic and atraumatic.     Right Ear: External ear normal.     Left Ear: External ear normal.     Nose: Nose normal.     Mouth/Throat:     Mouth: Mucous membranes are moist.  Eyes:     General:  No scleral icterus.    Conjunctiva/sclera: Conjunctivae normal.  Cardiovascular:     Rate and Rhythm: Normal rate and regular rhythm.     Heart sounds: Normal heart sounds. No murmur heard.    No friction rub. No gallop.  Pulmonary:     Effort: Pulmonary effort is normal. No respiratory distress.     Breath sounds: Normal breath sounds.  Abdominal:     General: Bowel sounds are normal. There is no distension.     Palpations: Abdomen is soft. There is no mass.     Tenderness: There is abdominal tenderness in the right upper quadrant, epigastric area and left upper quadrant. There is no guarding.  Musculoskeletal:     Cervical back: Normal range of motion.  Skin:    General: Skin is warm and dry.  Neurological:     Mental Status:  She is alert and oriented to person, place, and time.  Psychiatric:        Behavior: Behavior normal.     ED Results / Procedures / Treatments   Labs (all labs ordered are listed, but only abnormal results are displayed) Labs Reviewed  LIPASE, BLOOD - Abnormal; Notable for the following components:      Result Value   Lipase 2,669 (*)    All other components within normal limits  COMPREHENSIVE METABOLIC PANEL WITH GFR - Abnormal; Notable for the following components:   Glucose, Bld 118 (*)    AST 209 (*)    ALT 181 (*)    Alkaline Phosphatase 189 (*)    All other components within normal limits  CBC - Abnormal; Notable for the following components:   WBC 12.9 (*)    All other components within normal limits  URINALYSIS, ROUTINE W REFLEX MICROSCOPIC  PREGNANCY, URINE    EKG EKG Interpretation Date/Time:  Thursday Jan 12 2024 17:35:15 EDT Ventricular Rate:  88 PR Interval:  136 QRS Duration:  74 QT Interval:  346 QTC Calculation: 418 R Axis:   -6  Text Interpretation: Normal sinus rhythm Anterolateral infarct , age undetermined Abnormal ECG When compared with ECG of 23-Feb-2014 16:23, No significant change since last tracing Confirmed by Racheal Buddle [phone]) on 01/12/2024 5:37:53 PM  Radiology No results found.  Procedures Procedures    Medications Ordered in ED Medications  HYDROmorphone (DILAUDID) injection 1 mg (has no administration in time range)  ondansetron (ZOFRAN) injection 4 mg (has no administration in time range)  piperacillin-tazobactam (ZOSYN) IVPB 3.375 g (has no administration in time range)    ED Course/ Medical Decision Making/ A&P Clinical Course as of 01/12/24 1953  Thu Jan 12, 2024  1943 Lipase(!): 2,669 [AH]  1943 Alkaline Phosphatase(!): 189 [AH]  1943 ALT(!): 181 [AH]  1943 AST(!): 209 [AH]  1943 WBC(!): 12.9 [AH]    Clinical Course User Index [AH] Tama Fails, PA-C                                 Medical Decision  Making Amount and/or Complexity of Data Reviewed Labs: ordered. Decision-making details documented in ED Course. Radiology: ordered.  Risk Prescription drug management. Parenteral controlled substances. Decision regarding hospitalization.   Patient here with epigastric abdominal pain and vomiting. Differential diagnosis of epigastric pain includes: Functional or nonulcer dyspepsia (UD, GERD, Gastritis, , pancreatitis or pancreatic cancer, overeating indigestion antibiotic, gastroparesis, lactose intolerance, malabsorption gastric cancer, parasitic infection, cholelithiasis, choledocholithiasis, or cholangitis, ACS, pericarditis, pneumonia, abdominal hernia,  pregnancy, intestinal ischemia, esophageal rupture, gastric volvulus, hepatitis.   has a past medical history of Allergy, Anemia, Asthma, Carpal tunnel syndrome, Kidney stones, PONV (postoperative nausea and vomiting), Seasonal allergies, and Vitamin B12 deficiency (07/30/2019). This is mostly noncontributory  SDOH Screenings   Tobacco Use: High Risk (01/09/2024)   Received from [facility]    I reviewed and interpreted labs that include a lipase of 2669.  White count is 12.9.  Glucose 118.  AST 209, ALT 181 and alk phos 189.   I visualized and interpreted right upper quadrant ultrasound. Multiple gallstones and dilated CBD- suspect choledocholithiasis.   Dr. Elvin Hammer contacted recommends aggressive fluid rehydration, pain control, n.p.o. status and admission.  The plan to consult on the patient tomorrow morning.  Case discussed with Dr. Sendil who will admit the patient.  Informed of all findings and plan.  She agrees with plan for admission at this time.  Pain is well-controlled after IV fluids and pain medications.        Final Clinical Impression(s) / ED Diagnoses Final diagnoses:  None    Rx / DC Orders ED Discharge Orders     None         Tama Fails, PA-C 01/13/24 1057    Tonya Fredrickson,  MD 01/14/24 1026

## 2024-01-13 ENCOUNTER — Ambulatory Visit: Payer: Self-pay | Admitting: General Surgery

## 2024-01-13 ENCOUNTER — Inpatient Hospital Stay (HOSPITAL_COMMUNITY)

## 2024-01-13 DIAGNOSIS — K838 Other specified diseases of biliary tract: Secondary | ICD-10-CM

## 2024-01-13 DIAGNOSIS — E538 Deficiency of other specified B group vitamins: Secondary | ICD-10-CM | POA: Diagnosis not present

## 2024-01-13 DIAGNOSIS — K810 Acute cholecystitis: Secondary | ICD-10-CM | POA: Diagnosis not present

## 2024-01-13 DIAGNOSIS — Z716 Tobacco abuse counseling: Secondary | ICD-10-CM

## 2024-01-13 DIAGNOSIS — R7989 Other specified abnormal findings of blood chemistry: Secondary | ICD-10-CM | POA: Diagnosis not present

## 2024-01-13 DIAGNOSIS — R932 Abnormal findings on diagnostic imaging of liver and biliary tract: Secondary | ICD-10-CM

## 2024-01-13 DIAGNOSIS — K851 Biliary acute pancreatitis without necrosis or infection: Secondary | ICD-10-CM | POA: Diagnosis not present

## 2024-01-13 LAB — COMPREHENSIVE METABOLIC PANEL WITH GFR
ALT: 169 U/L — ABNORMAL HIGH (ref 0–44)
AST: 139 U/L — ABNORMAL HIGH (ref 15–41)
Albumin: 3.4 g/dL — ABNORMAL LOW (ref 3.5–5.0)
Alkaline Phosphatase: 117 U/L (ref 38–126)
Anion gap: 9 (ref 5–15)
BUN: 7 mg/dL (ref 6–20)
CO2: 23 mmol/L (ref 22–32)
Calcium: 9.1 mg/dL (ref 8.9–10.3)
Chloride: 110 mmol/L (ref 98–111)
Creatinine, Ser: 0.62 mg/dL (ref 0.44–1.00)
GFR, Estimated: >60 mL/min (ref >60)
Glucose, Bld: 91 mg/dL (ref 70–99)
Potassium: 4 mmol/L (ref 3.5–5.1)
Sodium: 142 mmol/L (ref 135–145)
Total Bilirubin: 0.9 mg/dL (ref 0.0–1.2)
Total Protein: 6.1 g/dL — ABNORMAL LOW (ref 6.5–8.1)

## 2024-01-13 LAB — SURGICAL PCR SCREEN
MRSA, PCR: NEGATIVE
Staphylococcus aureus: NEGATIVE

## 2024-01-13 LAB — CBC
HCT: 40.7 % (ref 36.0–46.0)
Hemoglobin: 13.5 g/dL (ref 12.0–15.0)
MCH: 29.3 pg (ref 26.0–34.0)
MCHC: 33.2 g/dL (ref 30.0–36.0)
MCV: 88.5 fL (ref 80.0–100.0)
Platelets: 278 10*3/uL (ref 150–400)
RBC: 4.6 MIL/uL (ref 3.87–5.11)
RDW: 12.2 % (ref 11.5–15.5)
WBC: 6.8 10*3/uL (ref 4.0–10.5)
nRBC: 0 % (ref 0.0–0.2)

## 2024-01-13 LAB — LIPID PANEL
Cholesterol: 151 mg/dL (ref 0–200)
HDL: 36 mg/dL — ABNORMAL LOW (ref >40)
LDL Cholesterol: 92 mg/dL (ref 0–99)
Total CHOL/HDL Ratio: 4.2 ratio
Triglycerides: 115 mg/dL (ref <150)
VLDL: 23 mg/dL (ref 0–40)

## 2024-01-13 LAB — PROTIME-INR
INR: 1 (ref 0.8–1.2)
Prothrombin Time: 13.5 s (ref 11.4–15.2)

## 2024-01-13 LAB — MAGNESIUM: Magnesium: 2 mg/dL (ref 1.7–2.4)

## 2024-01-13 LAB — LIPASE, BLOOD: Lipase: 36 U/L (ref 11–51)

## 2024-01-13 LAB — HIV ANTIBODY (ROUTINE TESTING W REFLEX): HIV Screen 4th Generation wRfx: NONREACTIVE

## 2024-01-13 LAB — PHOSPHORUS: Phosphorus: 3.8 mg/dL (ref 2.5–4.6)

## 2024-01-13 MED ORDER — TECHNETIUM TC 99M MEBROFENIN IV KIT
5.0000 | PACK | Freq: Once | INTRAVENOUS | Status: AC | PRN
Start: 2024-01-13 — End: 2024-01-13
  Administered 2024-01-13: 5 via INTRAVENOUS

## 2024-01-13 MED ORDER — PIPERACILLIN-TAZOBACTAM 3.375 G IVPB
3.3750 g | Freq: Three times a day (TID) | INTRAVENOUS | Status: DC
  Administered 2024-01-13 – 2024-01-15 (×7): 3.375 g via INTRAVENOUS
  Filled 2024-01-13 (×7): qty 50

## 2024-01-13 MED ORDER — ONDANSETRON HCL 4 MG/2ML IJ SOLN: 4.0000 mg | Freq: Four times a day (QID) | INTRAMUSCULAR | Status: DC | PRN

## 2024-01-13 MED ORDER — POLYETHYLENE GLYCOL 3350 17 G PO PACK: 17.0000 g | PACK | Freq: Every day | ORAL | Status: DC | PRN

## 2024-01-13 MED ORDER — SODIUM CHLORIDE 0.9% FLUSH
3.0000 mL | Freq: Two times a day (BID) | INTRAVENOUS | Status: DC
  Administered 2024-01-13 – 2024-01-14 (×3): 3 mL via INTRAVENOUS

## 2024-01-13 MED ORDER — MELATONIN 3 MG PO TABS: 6.0000 mg | ORAL_TABLET | Freq: Every evening | ORAL | Status: DC | PRN

## 2024-01-13 MED ORDER — HYDROMORPHONE HCL 1 MG/ML IJ SOLN: 0.5000 mg | INTRAMUSCULAR | Status: DC | PRN

## 2024-01-13 MED ORDER — HYDROMORPHONE HCL 1 MG/ML IJ SOLN: 1.0000 mg | INTRAMUSCULAR | Status: DC | PRN

## 2024-01-13 MED ORDER — NICOTINE 7 MG/24HR TD PT24
7.0000 mg | MEDICATED_PATCH | Freq: Every day | TRANSDERMAL | Status: DC
  Administered 2024-01-13 – 2024-01-15 (×2): 7 mg via TRANSDERMAL
  Filled 2024-01-13 (×3): qty 1

## 2024-01-13 NOTE — Consult Note (Addendum)
 Consultation  Referring Provider: [facility]/ P[facility] Primary Care Physician:  Pcp, No Primary Gastroenterologist:  Dr.Gessner  Reason for Consultation: Gallstone pancreatitis  HPI: Becky King is a 53 y.o. female with history of asthma, B12 deficiency, nephrolithiasis, hyperlipidemia who presented to the emergency room last p.m. after a prolonged episode of gastric/right upper quadrant pain radiating into her back associated with nausea and vomiting.  Patient relates that she has been having intermittent sporadic episodes over the past year usually lasting some hours and then resolving, and may go months in between these episodes.  On 01/07/2024 she had 1 of these episodes which was worse than previous episodes, associated with nausea and vomiting and persistence of pain.  She tried to wait it out for couple of days, says the level of pain would wax and wane some but never would resolve and at its worst was an 8-10 out of 10.  She did have a fever to 101 at home on 01/07/2024.  She was unable to consistently keep down any liquids and therefore presented to the ER.  She says currently she feels better than she did yesterday he is not having severe pain and has not vomited today. Ultrasound through the ER last night showed gallbladder full of gallstones measuring up to 9 mm, thickened gallbladder wall at 4.8 mm, CBD 6.9 mm and multiple hepatic cysts. Labs on admission WBC 12.9/hemoglobin 14.9/hematocrit 45.5/MCV 89 Lipase 2669 BUN 16/creatinine 0.6 T. bili 0.7/alk phos 189/AST 209/ALT 181  Today WBC 6.8/hemoglobin 13.5/hematocrit 40.7 INR 1.0 T. bili 0.9/alk phos 117/AST 139/ALT 169  She has not been evaluated by surgery as yet. HIDA scan ordered and patient on her way to imaging at the end of my interview   Past Medical History:  Diagnosis Date   Allergy    Anemia    Asthma    HAS MDI   Carpal tunnel syndrome    CARPAL TUNNEL WITH SURGERY    Kidney stones    PONV (postoperative  nausea and vomiting)    Seasonal allergies    Vitamin B12 deficiency 07/30/2019    Past Surgical History:  Procedure Laterality Date   APPENDECTOMY     HERNIA REPAIR     TUBAL LIGATION      Prior to Admission medications   Medication Sig Start Date End Date Taking? Authorizing Provider  Acetaminophen -Caff-Pyrilamine (MIDOL  COMPLETE PO) Take 1 tablet by mouth 2 (two) times daily as needed (pain).   Yes [provider]  albuterol  (VENTOLIN  HFA) 108 (90 Base) MCG/ACT inhaler Inhale 1-2 puffs into the lungs every 6 (six) hours as needed for wheezing or shortness of breath.   Yes [provider]  clobetasol cream (TEMOVATE) 0.05 % Apply 1 Application topically 2 (two) times daily as needed (psoriasis). 11/08/23  Yes [provider]  Cyanocobalamin  (VITAMIN B 12 PO) Take 1 tablet by mouth daily.   Yes [provider]  Emollient (EUCERIN) lotion Apply 1 Application topically as needed for dry skin.   Yes [provider]  estradiol  (ESTRACE  VAGINAL) 0.1 MG/GM vaginal cream Place 1 g vaginally 2 (two) times a week. Patient taking differently: Place 1 g vaginally See admin instructions. Twice a week if needed for dryness 10/18/22  Yes Wallace, Tiffany A, NP  fexofenadine (ALLEGRA) 180 MG tablet Take 180 mg by mouth daily.   Yes [provider]  fluticasone  (FLONASE ) 50 MCG/ACT nasal spray Place 1 spray into both nostrils daily as needed for allergies or rhinitis.  Yes [provider]  polyvinyl alcohol (LIQUIFILM TEARS) 1.4 % ophthalmic solution Place 1 drop into both eyes as needed for dry eyes. [employer]ch [employer] Lomb For dry eyes   Yes [provider]  metoCLOPramide (REGLAN) 10 MG tablet Take 10 mg by mouth in the morning, at noon, in the evening, and at bedtime. Patient not taking: Reported on 01/13/2024 01/09/24 01/19/24  [provider]    Current Facility-Administered Medications  Medication Dose Route Frequency Provider  Last Rate Last Admin   HYDROmorphone (DILAUDID) injection 0.5 mg  0.5 mg Intravenous Q4H PRN Arnulfo Larch, MD       Or   HYDROmorphone (DILAUDID) injection 1 mg  1 mg Intravenous Q4H PRN Arnulfo Larch, MD       lactated ringers infusion   Intravenous Continuous Tama Fails, PA-C 250 mL/hr at 01/13/24 0458 New Bag at 01/13/24 0458   melatonin tablet 6 mg  6 mg Oral QHS PRN Segars, Arlyce Lambert, MD       nicotine (NICODERM CQ - dosed in mg/24 hr) patch 7 mg  7 mg Transdermal Daily Segars, Arlyce Lambert, MD   7 mg at 01/13/24 0807   ondansetron (ZOFRAN) injection 4 mg  4 mg Intravenous Q6H PRN Segars, Arlyce Lambert, MD       piperacillin-tazobactam (ZOSYN) IVPB 3.375 g  3.375 g Intravenous Q8H Segars, Jonathan, MD 12.5 mL/hr at 01/13/24 0457 3.375 g at 01/13/24 0457   polyethylene glycol (MIRALAX / GLYCOLAX) packet 17 g  17 g Oral Daily PRN Arnulfo Larch, MD       sodium chloride  flush (NS) 0.9 % injection 3 mL  3 mL Intravenous Q12H Arnulfo Larch, MD   3 mL at 01/13/24 0807    Allergies as of 01/12/2024 - Review Complete 01/12/2024  Allergen Reaction Noted   Latex Itching, Rash, and Other (See Comments) 02/23/2014    Family History  Problem Relation Age of Onset   Fibromyalgia Mother    Heart disease Mother    Colon polyps Mother    Diabetes Maternal Grandmother    Colon cancer Neg Hx    Esophageal cancer Neg Hx    Stomach cancer Neg Hx    Rectal cancer Neg Hx     Social History   Socioeconomic History   Marital status: Single    Spouse name: Not on file   Number of children: Not on file   Years of education: Not on file   Highest education level: Not on file  Occupational History   Not on file  Tobacco Use   Smoking status: Every Day    Current packs/day: 0.50    Types: Cigarettes   Smokeless tobacco: Never  Vaping Use   Vaping status: Never Used  Substance and Sexual Activity   Alcohol use: Yes    Comment: Rare   Drug use: No   Sexual activity: Yes    Comment:  BTL-intercourse age 33, less than 5 sexual partners,des neg  Other Topics Concern   Not on file  Social History Narrative   Right handed   Caffeine~ 1 cup per day    Lives at home with her mom and child    Social Drivers of Corporate investment banker Strain: Not on file  Food Insecurity: No Food Insecurity (01/12/2024)   Hunger Vital Sign    Worried About Running Out of Food in the Last Year: Never true    Ran Out of Food in the Last Year: Never true  Transportation Needs:  No Transportation Needs (01/12/2024)   PRAPARE - Administrator, Civil Service (Medical): No    Lack of Transportation (Non-Medical): No  Physical Activity: Not on file  Stress: Not on file  Social Connections: Moderately Isolated (01/13/2024)   Social Connection and Isolation Panel [NHANES]    Frequency of Communication with Friends and Family: More than three times a week    Frequency of Social Gatherings with Friends and Family: Three times a week    Attends Religious Services: Never    Active Member of Clubs or Organizations: No    Attends Engineer, structural: More than 4 times per year    Marital Status: Never married  Intimate Partner Violence: Not At Risk (01/12/2024)   Humiliation, Afraid, Rape, and Kick questionnaire    Fear of Current or Ex-Partner: No    Emotionally Abused: No    Physically Abused: No    Sexually Abused: No    Review of Systems: Pertinent positive and negative review of systems were noted in the above HPI section.  All other review of systems was otherwise negative.   Physical Exam: Vital signs in last 24 hours: Temp:  [97.7 F (36.5 C)-98.2 F (36.8 C)] 97.9 F (36.6 C) (05/23 0749) Pulse Rate:  [55-91] 57 (05/23 0749) Resp:  [16-23] 18 (05/23 0749) BP: (101-139)/(62-99) 139/67 (05/23 0749) SpO2:  [96 %-99 %] 96 % (05/23 0749) Weight:  [87.1 kg] 87.1 kg (05/22 1728) Last BM Date : 01/12/24 General:   Alert,  Well-developed, well-nourished, pleasant  and cooperative in NAD Head:  Normocephalic and atraumatic. Eyes:  Sclera clear, no icterus.   Conjunctiva pink. Ears:  Normal auditory acuity. Nose:  No deformity, discharge,  or lesions. Mouth:  No deformity or lesions.   Neck:  Supple; no masses or thyromegaly. Lungs:  Clear throughout to auscultation.   No wheezes, crackles, or rhonchi.  Heart:  Regular rate and rhythm; no murmurs, clicks, rubs,  or gallops. Abdomen:  Soft, obese, she is tender across the upper abdomen, epigastrium and right upper quadrant no definite guarding or rebound, no palpable mass or hepatosplenomegaly bowel sounds present.   Rectal:  Deferred  Msk:  Symmetrical without gross deformities. . Pulses:  Normal pulses noted. Extremities:  Without clubbing or edema. Neurologic:  Alert and  oriented x4;  grossly normal neurologically. Skin:  Intact without significant lesions or rashes.. Psych:  Alert and cooperative. Normal mood and affect.  Intake/Output from previous day: 05/22 0701 - 05/23 0700 In: 41.7 [IV Piggyback:41.7] Out: -  Intake/Output this shift: No intake/output data recorded.  Lab Results: Recent Labs    01/12/24 1732 01/13/24 0738  WBC 12.9* 6.8  HGB 14.9 13.5  HCT 45.5 40.7  PLT 331 278   BMET Recent Labs    01/12/24 1732 01/13/24 0757  NA 140 142  K 3.9 4.0  CL 103 110  CO2 24 23  GLUCOSE 118* 91  BUN 16 7  CREATININE 0.60 0.62  CALCIUM 10.1 9.1   LFT Recent Labs    01/13/24 0757  PROT 6.1*  ALBUMIN 3.4*  AST 139*  ALT 169*  ALKPHOS 117  BILITOT 0.9   PT/INR Recent Labs    01/13/24 0738  LABPROT 13.5  INR 1.0   Hepatitis Panel No results for input(s): "HEPBSAG", "HCVAB", "HEPAIGM", "HEPBIGM" in the last 72 hours.   IMPRESSION:  #10 53 year old white female presenting with acute epigastric right upper quadrant to back pain associated with nausea and vomiting  since 01/07/2024.  Had had similar but less severe episodes very sporadically over the past year  usually lasting a couple of hours with complete resolution.  Workup thus far seems most consistent with acute gallstone pancreatitis, possible acute cholecystitis- with ultrasound showing a gallbladder full of stones, thickened gallbladder wall at 4.8 mm, and CBD of 6.9 mm  LFTs elevated but no hyperbilirubinemia, trending down today Lipase 2600  No parameters worrisome for severe pancreatitis  #2 B12 deficiency #3.  History of asthma  Plan; keep n.p.o. for now, patient on her way to HIDA scan Needs surgical consultation Consider MRI/MRCP Repeat lipase today Currently on LR at 250 cc/h-will decrease to 150 cc/h later this afternoon Continue Zosyn Will await surgical recommendations but may be appropriate for laparoscopic cholecystectomy and [facility] will follow with you   Dmoni Fortson  01/13/2024, 10:16 AM

## 2024-01-13 NOTE — H&P (Addendum)
 History and Physical    Becky King NWG:[id number] DOB: Dec 23, 1970 DOA: 01/12/2024  PCP: Pcp, No   Patient coming from: Transfer from [facility] ED    Chief Complaint:  Chief Complaint  Patient presents with   Abdominal Pain    HPI:  Becky King is a 53 y.o. female with hx of hyperlipidemia, B12 deficiency, abdominal surgeries including prior hernia repair, appendectomy, salpingectomy, who is transferred from [facility] ED due to gallstone pancreatitis.  She reports that over the past year she has had rare episodes of severe epigastric pain accompanied by nausea and vomiting.  Typically these episodes are short lasting in the range of hours.  With months in between episodes.  However since Saturday 5/17 she has had more persistent pain, intolerance of p.o.'s with nausea and vomiting.  Pain is currently in the epigastrium and right upper quadrant and it radiates to her back.  Denies any current or prior alcohol use associated with episodes.  No history of gallstones/gallbladder disease to her knowledge.    Review of Systems:  ROS complete and negative except as marked above   Allergies  Allergen Reactions   Latex Itching, Rash and Other (See Comments)    Cause blisters also    Prior to Admission medications   Medication Sig Start Date End Date Taking? Authorizing Provider  acetaminophen  (TYLENOL ) 500 MG tablet Take 500 mg by mouth every 6 (six) hours as needed for moderate pain.    [provider]  Acetaminophen -Caff-Pyrilamine (MIDOL  COMPLETE PO) Take by mouth.    [provider]  albuterol  (VENTOLIN  HFA) 108 (90 Base) MCG/ACT inhaler Inhale into the lungs every 6 (six) hours as needed for wheezing or shortness of breath.    [provider]  Cyanocobalamin  (VITAMIN B 12 PO) Take by mouth.    [provider]  estradiol  (ESTRACE  VAGINAL) 0.1 MG/GM vaginal cream Place 1 g vaginally 2 (two) times a week. 10/18/22   Andee Bamberger, NP   fexofenadine (ALLEGRA) 180 MG tablet Take 180 mg by mouth daily.    [provider]  fluticasone  (FLONASE ) 50 MCG/ACT nasal spray Place 1 spray into both nostrils daily. Patient taking differently: Place 1 spray into both nostrils daily as needed for allergies. 12/17/16   Layden, Lindsey A, PA-C  metroNIDAZOLE  (FLAGYL ) 500 MG tablet Take 1 tablet (500 mg total) by mouth 2 (two) times daily. 10/19/22   Andee Bamberger, NP  OVER THE COUNTER MEDICATION Apply 1 application topically 2 (two) times daily. Psoriasis lotion    [provider]  polyvinyl alcohol (LIQUIFILM TEARS) 1.4 % ophthalmic solution Place 1 drop into both eyes as needed. B[employer]ch & Lomb For dry eyes    [provider]  Rhubarb (ESTROVEN COMPLETE PO) Take by mouth.    [provider]    Past Medical History:  Diagnosis Date   Allergy    Anemia    Asthma    HAS MDI   Carpal tunnel syndrome    CARPAL TUNNEL WITH SURGERY    Kidney stones    PONV (postoperative nausea and vomiting)    Seasonal allergies    Vitamin B12 deficiency 07/30/2019    Past Surgical History:  Procedure Laterality Date   APPENDECTOMY     HERNIA REPAIR     TUBAL LIGATION       reports that she has been smoking cigarettes. She has never used smokeless tobacco. She reports current alcohol use. She reports that she does not use  drugs.  Family History  Problem Relation Age of Onset   Fibromyalgia Mother    Heart disease Mother    Colon polyps Mother    Diabetes Maternal Grandmother    Colon cancer Neg Hx    Esophageal cancer Neg Hx    Stomach cancer Neg Hx    Rectal cancer Neg Hx      Physical Exam: Vitals:   01/12/24 2000 01/12/24 2030 01/13/24 0000 01/13/24 0424  BP: (!) 139/99 133/77 101/77 126/62  Pulse: (!) 58 (!) 58 76 (!) 55  Resp: (!) 23 16 18 17   Temp:   98.2 F (36.8 C) 97.9 F (36.6 C)  TempSrc:   Oral Oral  SpO2: 97% 99%  98%  Weight:      Height:        Gen: Awake, alert, NAD    CV: Regular, normal S1, S2, no murmurs  Resp: Normal WOB, CTAB  Abd: Flat, normoactive, moderate tenderness in the epigastrium and right upper quadrant.  Negative Murphy's. MSK: Symmetric, no edema  Skin: No rashes or lesions to exposed skin  Neuro: Alert and interactive  Psych: euthymic, appropriate    Data review:   Labs reviewed, notable for:   T. bili 0.7, AST 209, ALT 181, alk phos 189, lipase 2669 WBC 12  Micro:  Results for orders placed or performed during the hospital encounter of 02/23/14  Rapid strep screen     Status: None   Collection Time: 02/23/14  4:01 PM   Specimen: Oral Mucosa/Gingiva; Throat  Result Value Ref Range Status   Streptococcus, Group A Screen (Direct) NEGATIVE NEGATIVE Final    Comment: (NOTE) A Rapid Antigen test may result negative if the antigen level in the sample is below the detection level of this test. The FDA has not cleared this test as a stand-alone test therefore the rapid antigen negative result has reflexed to a Group A Strep culture.  Culture, Group A Strep     Status: None   Collection Time: 02/23/14  4:01 PM   Specimen: Throat  Result Value Ref Range Status   Specimen Description THROAT  Final   Special Requests NONE  Final   Culture   Final    No Beta Hemolytic Streptococci Isolated Performed at [facility]   Report Status 02/25/2014 FINAL  Final    Imaging reviewed:  US  Abdomen Limited RUQ (LIVER/GB) Result Date: 01/12/2024 CLINICAL DATA:  Elevated LFTs EXAM: ULTRASOUND ABDOMEN LIMITED RIGHT UPPER QUADRANT COMPARISON:  None Available. FINDINGS: Gallbladder: The gallbladder is filled with gallstones measuring to 9 mm. Gallbladder wall thickening is present measuring 4.8 mm. Sonographic Abigail Abler sign is negative. Common bile duct: Diameter: 6.9 mm.  No intrahepatic biliary ductal dilatation. Liver: Multiple hepatic cysts are present. The largest in the left lobe measures 4.4 x 3.2 x 2.2 cm. The largest in the right lobe  measures 3.6 x 3.3 x 3.6 cm. Within normal limits in parenchymal echogenicity. Portal vein is patent on color Doppler imaging with normal direction of blood flow towards the liver. Other: None. IMPRESSION: 1. Cholelithiasis with gallbladder wall thickening. Sonographic Abigail Abler sign is negative. Findings are equivocal for acute cholecystitis. If there is clinical concern for acute cholecystitis, recommend further evaluation with HIDA scan. 2. Common bile duct is mildly dilated measuring 6.9 mm. Recommend correlation with LFTs. 3. Multiple hepatic cysts. Electronically Signed   By: Tyron Gallon M.D.   On: 01/12/2024 19:53    EKG:  Personally reviewed, sinus rhythm, ST  depression T wave inversion V2 present but more pronounced from prior EKG.  ED Course:  Outside EDP consulted with Dr. Rena Carnes, GI recommending for IV hydration, n.p.o. status and pain control, GI to see in the morning.     Assessment/Plan:  53 y.o. female with hx hyperlipidemia, B12 deficiency, abdominal surgeries including prior hernia repair, appendectomy, salpingectomy, who is transferred from [facility] ED due to gallstone pancreatitis.  Gallstone pancreatitis Mild dilation of CBD, question underlying choledocholithiasis Equivocal imaging findings for cholecystitis Cholelithiasis with clinical history of episodic biliary colic over the past year.  Acute worsening of pain over the past 5 days prior to admission.  Associated intolerance of p.o.'s and nausea and vomiting.  On initial evaluation T. bili 0.7, AST 209, ALT 181, alk phos 189, lipase 2669.  Right upper quadrant ultrasound notable for cholelithiasis, gallbladder wall thickening equivocal for cholecystitis, mild dilation in CBD 6.9 mm.  - [facility] consulted, will see patient in the morning; outside EDP discussed with Dr. Elvin Hammer. Have since discussed further with Dr. Rosalee Collins.  If worsening LFT trend may proceed with MRCP as initial step.  If LFTs stable likely gen surg  consult for evaluation for cholecystectomy with intraoperative cholangiogram as next step. - N.p.o. with sips with meds - S/p 1 L IV fluid, continue MIVF at 250 cc an hour - Continue Zosyn 3375 mg IV every 8 hour for now for possible cholecystitis - Pain control Dilaudid 0.5/1 mg IV every 4 hours as needed for moderate/severe until able to better tolerate p.o.'s  Smoking cessation  Current smoker at 0.5 PPD, interested in quitting.  - Counseled on cessation. Nicotine patch 7 mg, RX at discharge   Incidental findings: Multiple hepatic cysts  Chronic medical problems: Hyperlipidemia: Not on cholesterol-lowering agents B12 deficiency: Noted outpatient follow-up  Body mass index is 35.12 kg/m.  Obesity class II, would benefit from weight loss outpatient  DVT prophylaxis:  SCDs Code Status:  Full Code Diet:  Diet Orders (From admission, onward)     Start     Ordered   01/13/24 0102  Diet NPO time specified Except for: Sips with Meds  Diet effective now       Question:  Except for  Answer:  Bula Carney with Meds   01/13/24 0101           Family Communication:  No   Consults:  [facility]   Admission status:   Inpatient, Telemetry bed  Severity of Illness: The appropriate patient status for this patient is INPATIENT. Inpatient status is judged to be reasonable and necessary in order to provide the required intensity of service to ensure the patient's safety. The patient's presenting symptoms, physical exam findings, and initial radiographic and laboratory data in the context of their chronic comorbidities is felt to place them at high risk for further clinical deterioration. Furthermore, it is not anticipated that the patient will be medically stable for discharge from the hospital within 2 midnights of admission.   * I certify that at the point of admission it is my clinical judgment that the patient will require inpatient hospital care spanning beyond 2 midnights from the point of  admission due to high intensity of service, high risk for further deterioration and high frequency of surveillance required.*   Arnulfo Larch, MD [facility]  How to contact the [facility] Attending or Consulting provider 7A - 7P or covering provider during after hours 7P -7A, for this patient.  Check the care team in [facility] and  look for a) attending/consulting [facility] provider listed and b) the [facility] team listed Log into [email or web address] and use [facility] universal password to access. If you do not have the password, please contact the hospital operator. Locate the [facility] provider you are looking for under [facility] and page to a number that you can be directly reached. If you still have difficulty reaching the provider, please page the [facility] (Director on Call) for the Hospitalists listed on amion for assistance.  01/13/2024, 5:54 AM

## 2024-01-13 NOTE — Consult Note (Addendum)
 Becky King [birth year]  [id number].    Requesting MD: Lilyan Remedies, MD Chief Complaint/Reason for Consult: biliary pancreatitis   HPI:  Becky King is a 53 y/o F with PMH asthma, HLD,and  B12 deficiency who [facility] with worsening epigastric pain and vomiting. Pain described as sharp epigastric pain with radiation across her abdomen. Associated with nausea and vomiting. She reports similar, less severe, episodes every couple of months over the last year. This current "flare" started on Saturday. The episodes usually resolve after a couple of hours but this time pain has not gone away. She denies fever, chills, urinary symptoms, diarrhea, constipation, or blood in her stools. Denies any new medications. Denies heavy alcohol use. Denies sick contacts.   Surgical history: appendectomy, tubal ligation, umbilical hernia repair Blood thinners: none Substance: occasional EtOH, smokes  ROS: As above Review of Systems  All other systems reviewed and are negative.   Family History  Problem Relation Age of Onset   Fibromyalgia Mother    Heart disease Mother    Colon polyps Mother    Diabetes Maternal Grandmother    Colon cancer Neg Hx    Esophageal cancer Neg Hx    Stomach cancer Neg Hx    Rectal cancer Neg Hx     Past Medical History:  Diagnosis Date   Allergy    Anemia    Asthma    HAS MDI   Carpal tunnel syndrome    CARPAL TUNNEL WITH SURGERY    Kidney stones    PONV (postoperative nausea and vomiting)    Seasonal allergies    Vitamin B12 deficiency 07/30/2019    Past Surgical History:  Procedure Laterality Date   APPENDECTOMY     HERNIA REPAIR     TUBAL LIGATION      Social History:  reports that she has been smoking cigarettes. She has never used smokeless tobacco. She reports current alcohol use. She reports that she does not use drugs.  Allergies:  Allergies  Allergen Reactions   Latex Itching, Rash and Other (See Comments)     Cause blisters also    Medications Prior to Admission  Medication Sig Dispense Refill   Acetaminophen -Caff-Pyrilamine (MIDOL  COMPLETE PO) Take 1 tablet by mouth 2 (two) times daily as needed (pain).     albuterol  (VENTOLIN  HFA) 108 (90 Base) MCG/ACT inhaler Inhale 1-2 puffs into the lungs every 6 (six) hours as needed for wheezing or shortness of breath.     clobetasol cream (TEMOVATE) 0.05 % Apply 1 Application topically 2 (two) times daily as needed (psoriasis).     Cyanocobalamin  (VITAMIN B 12 PO) Take 1 tablet by mouth daily.     Emollient (EUCERIN) lotion Apply 1 Application topically as needed for dry skin.     estradiol  (ESTRACE  VAGINAL) 0.1 MG/GM vaginal cream Place 1 g vaginally 2 (two) times a week. (Patient taking differently: Place 1 g vaginally See admin instructions. Twice a week if needed for dryness) 42.5 g 1   fexofenadine (ALLEGRA) 180 MG tablet Take 180 mg by mouth daily.     fluticasone  (FLONASE ) 50 MCG/ACT nasal spray Place 1 spray into both nostrils daily as needed for allergies or rhinitis.     polyvinyl alcohol (LIQUIFILM TEARS) 1.4 % ophthalmic solution Place 1 drop into both eyes as needed for dry eyes. [employer] For dry eyes     metoCLOPramide (REGLAN) 10 MG tablet Take 10 mg by mouth in the morning, at  noon, in the evening, and at bedtime. (Patient not taking: Reported on 01/13/2024)       Physical Exam: Blood pressure 137/81, pulse 66, temperature 97.7 F (36.5 C), temperature source Oral, resp. rate 17, height 5\' 2"  (1.575 m), weight 87.1 kg, SpO2 98%. General: Pleasant white female laying on hospital bed, appears stated age, NAD. HEENT: head -normocephalic, atraumatic; Eyes: PERRLA, no conjunctival injection; anicteric sclerae  Neck- Trachea is midline CV- RRR, normal S1/S2, no M/R/G, no lower extremity edema  Pulm- breathing is non-labored ORA Abd- soft, mild TTP in the RUQ, no rebound/guarding, no peritoneal signs MSK- UE/LE symmetrical, no  cyanosis, clubbing, or edema. Neuro- CN II-XII grossly in tact, no paresthesias. Psych- Alert and Oriented x3 with appropriate affect Skin: warm and dry, no rashes or lesions   Results for orders placed or performed during the hospital encounter of 01/12/24 (from the past 48 hours)  Lipase, blood     Status: Abnormal   Collection Time: 01/12/24  5:32 PM  Result Value Ref Range   Lipase 2,669 (H) 11 - 51 U/L    Comment: Performed at [facility], [address], [place], [place] [address]  Comprehensive metabolic panel     Status: Abnormal   Collection Time: 01/12/24  5:32 PM  Result Value Ref Range   Sodium 140 135 - 145 mmol/L   Potassium 3.9 3.5 - 5.1 mmol/L   Chloride 103 98 - 111 mmol/L   CO2 24 22 - 32 mmol/L   Glucose, Bld 118 (H) 70 - 99 mg/dL    Comment: Glucose reference range applies only to samples taken after fasting for at least 8 hours.   BUN 16 6 - 20 mg/dL   Creatinine, Ser 1.60 0.44 - 1.00 mg/dL   Calcium 10.9 8.9 - 32.3 mg/dL   Total Protein 8.0 6.5 - 8.1 g/dL   Albumin 4.6 3.5 - 5.0 g/dL   AST 557 (H) 15 - 41 U/L   ALT 181 (H) 0 - 44 U/L   Alkaline Phosphatase 189 (H) 38 - 126 U/L   Total Bilirubin 0.7 0.0 - 1.2 mg/dL   GFR, Estimated >32 >20 mL/min    Comment: (NOTE) Calculated using the CKD-EPI Creatinine Equation (2021)    Anion gap 13 5 - 15    Comment: Performed at [facility], [address] [place], [place] [address]  CBC     Status: Abnormal   Collection Time: 01/12/24  5:32 PM  Result Value Ref Range   WBC 12.9 (H) 4.0 - 10.5 K/uL   RBC 5.10 3.87 - 5.11 MIL/uL   Hemoglobin 14.9 12.0 - 15.0 g/dL   HCT 06.2 37.6 - 28.3 %   MCV 89.2 80.0 - 100.0 fL   MCH 29.2 26.0 - 34.0 pg   MCHC 32.7 30.0 - 36.0 g/dL   RDW 15.1 76.1 - 60.7 %   Platelets 331 150 - 400 K/uL   nRBC 0.0 0.0 - 0.2 %    Comment: Performed at [facility], [address], [place], [place] [address]  Urinalysis, Routine  w reflex microscopic -Urine, Clean Catch     Status: None   Collection Time: 01/12/24  5:32 PM  Result Value Ref Range   Color, Urine YELLOW YELLOW   APPearance CLEAR CLEAR   Specific Gravity, Urine 1.024 1.005 - 1.030   pH 7.0 5.0 - 8.0   Glucose, UA NEGATIVE NEGATIVE mg/dL   Hgb urine dipstick NEGATIVE NEGATIVE   Bilirubin  Urine NEGATIVE NEGATIVE   Ketones, ur NEGATIVE NEGATIVE mg/dL   Protein, ur NEGATIVE NEGATIVE mg/dL   Nitrite NEGATIVE NEGATIVE   Leukocytes,Ua NEGATIVE NEGATIVE    Comment: Performed at [facility], [address] [place], [place] [address]  Pregnancy, urine     Status: None   Collection Time: 01/12/24  5:32 PM  Result Value Ref Range   Preg Test, Ur NEGATIVE NEGATIVE    Comment:        THE SENSITIVITY OF THIS METHODOLOGY IS >25 mIU/mL. Performed at [facility], [address], [place], [place] [address]   CBC     Status: None   Collection Time: 01/13/24  7:38 AM  Result Value Ref Range   WBC 6.8 4.0 - 10.5 K/uL   RBC 4.60 3.87 - 5.11 MIL/uL   Hemoglobin 13.5 12.0 - 15.0 g/dL   HCT 47.8 29.5 - 62.1 %   MCV 88.5 80.0 - 100.0 fL   MCH 29.3 26.0 - 34.0 pg   MCHC 33.2 30.0 - 36.0 g/dL   RDW 30.8 65.7 - 84.6 %   Platelets 278 150 - 400 K/uL   nRBC 0.0 0.0 - 0.2 %    Comment: Performed at [facility], [address] [place], [place] [address]  Magnesium     Status: None   Collection Time: 01/13/24  7:38 AM  Result Value Ref Range   Magnesium 2.0 1.7 - 2.4 mg/dL    Comment: Performed at [facility], [address] [place], [place] [address]  Phosphorus     Status: None   Collection Time: 01/13/24  7:38 AM  Result Value Ref Range   Phosphorus 3.8 2.5 - 4.6 mg/dL    Comment: Performed at [facility], [address], [place], [place] [address]  Protime-INR     Status: None   Collection Time: 01/13/24  7:38 AM  Result Value Ref Range   Prothrombin Time 13.5 11.4 - 15.2 seconds   INR 1.0  0.8 - 1.2    Comment: (NOTE) INR goal varies based on device and disease states. Performed at [facility], [address] [place], [place] [address]   Lipid panel     Status: Abnormal   Collection Time: 01/13/24  7:38 AM  Result Value Ref Range   Cholesterol 151 0 - 200 mg/dL   Triglycerides 366 <440 mg/dL   HDL 36 (L) >34 mg/dL   Total CHOL/HDL Ratio 4.2 RATIO   VLDL 23 0 - 40 mg/dL   LDL Cholesterol 92 0 - 99 mg/dL    Comment:        Total Cholesterol/HDL:CHD Risk Coronary Heart Disease Risk Table                     Men   Women  1/2 Average Risk   3.4   3.3  Average Risk       5.0   4.4  2 X Average Risk   9.6   7.1  3 X Average Risk  23.4   11.0        Use the calculated Patient Ratio above and the CHD Risk Table to determine the patient's CHD Risk.        ATP III CLASSIFICATION (LDL):  <100     mg/dL   Optimal  742-595  mg/dL   Near or Above  Optimal  130-159  mg/dL   Borderline  161-096  mg/dL   High  >045     mg/dL   Very High Performed at [facility], [address] [place], [place] [address]   Comprehensive metabolic panel     Status: Abnormal   Collection Time: 01/13/24  7:57 AM  Result Value Ref Range   Sodium 142 135 - 145 mmol/L   Potassium 4.0 3.5 - 5.1 mmol/L   Chloride 110 98 - 111 mmol/L   CO2 23 22 - 32 mmol/L   Glucose, Bld 91 70 - 99 mg/dL    Comment: Glucose reference range applies only to samples taken after fasting for at least 8 hours.   BUN 7 6 - 20 mg/dL   Creatinine, Ser 1.91 0.44 - 1.00 mg/dL   Calcium 9.1 8.9 - 47.8 mg/dL   Total Protein 6.1 (L) 6.5 - 8.1 g/dL   Albumin 3.4 (L) 3.5 - 5.0 g/dL   AST 295 (H) 15 - 41 U/L   ALT 169 (H) 0 - 44 U/L   Alkaline Phosphatase 117 38 - 126 U/L   Total Bilirubin 0.9 0.0 - 1.2 mg/dL   GFR, Estimated >62 >13 mL/min    Comment: (NOTE) Calculated using the CKD-EPI Creatinine Equation (2021)    Anion gap 9 5 - 15    Comment: Performed at  [facility],  [address] [place], [place] [address]   US  Abdomen Limited RUQ (LIVER/GB) Result Date: 01/12/2024 CLINICAL DATA:  Elevated LFTs EXAM: ULTRASOUND ABDOMEN LIMITED RIGHT UPPER QUADRANT COMPARISON:  None Available. FINDINGS: Gallbladder: The gallbladder is filled with gallstones measuring to 9 mm. Gallbladder wall thickening is present measuring 4.8 mm. Sonographic Abigail Abler sign is negative. Common bile duct: Diameter: 6.9 mm.  No intrahepatic biliary ductal dilatation. Liver: Multiple hepatic cysts are present. The largest in the left lobe measures 4.4 x 3.2 x 2.2 cm. The largest in the right lobe measures 3.6 x 3.3 x 3.6 cm. Within normal limits in parenchymal echogenicity. Portal vein is patent on color Doppler imaging with normal direction of blood flow towards the liver. Other: None. IMPRESSION: 1. Cholelithiasis with gallbladder wall thickening. Sonographic Abigail Abler sign is negative. Findings are equivocal for acute cholecystitis. If there is clinical concern for acute cholecystitis, recommend further evaluation with HIDA scan. 2. Common bile duct is mildly dilated measuring 6.9 mm. Recommend correlation with LFTs. 3. Multiple hepatic cysts. Electronically Signed   By: Tyron Gallon M.D.   On: 01/12/2024 19:53      Assessment/Plan Gallstone pancreatitis  Possible calculous cholecystitis  - afebrile, WBC 6.9 (12.9 yesterday), - AST 139, ALT 169, alk phos 117, total bili 0.9 - lipase 2,669 yesterday, repeat pending - RUQ U/S shows cholelithiasis with gallbladder wall thickening, negative sonographic murphy's.  - HIDA was ordered by medical service and is pending   Given history consistent with biliary colic and now gallstone pancreatitis, I recommend cholecystectomy with IOC this admission. CBD is mildly dilated on ultrasound (6.9 mm) but, with normal bilirubin, I do not think MRCP is warranted at this time. Once she shows clinical improvement of pancreatitis we will schedule surgery, possibly as early  as tomorrow.    FEN - NPO, IVF VTE - SCd's, ok for DVT ppx from [facility] standpoint ID - Zosyn Admit - TRH service, GI is following   Armond Bertin, MD [facility] 01/13/2024, 3:36 PM Please see Amion for pager number during day hours 7:00am-4:30pm or 7:00am -  11:30am on weekends

## 2024-01-13 NOTE — Progress Notes (Signed)
 PROGRESS NOTE    Becky King  ZOX:[id number] DOB: Jun 29, 1971 DOA: 01/12/2024 PCP: Pcp, No   Brief Narrative:  Becky King is a 53 y.o. female with hx of hyperlipidemia, B12 deficiency, abdominal surgeries including prior hernia repair, appendectomy, salpingectomy, who is transferred from [facility] ED due to gallstone pancreatitis.   She reports that over the past year she has had rare episodes of severe epigastric pain accompanied by nausea and vomiting.  Typically these episodes are short lasting in the range of hours.  With months in between episodes.  However since Saturday 5/17 she has had more persistent pain, intolerance of p.o.'s with nausea and vomiting.  Pain is currently in the epigastrium and right upper quadrant and it radiates to her back.  Denies any current or prior alcohol use associated with episodes.  No history of gallstones/gallbladder disease to her knowledge.     Assessment & Plan:   Principal Problem:   Gallstone pancreatitis Active Problems:   Encounter for smoking cessation counseling  Acute gallstone pancreatitis / Mild dilation of CBD, question underlying choledocholithiasis and possible acute cholecystitis, POA: Cholelithiasis with clinical history of episodic biliary colic over the past year.  Acute worsening of pain over the past 5 days prior to admission. T. bili 0.7, AST 209, ALT 181, alk phos 189, lipase 2669.  Right upper quadrant ultrasound notable for cholelithiasis, gallbladder wall thickening equivocal for cholecystitis, mild dilation in CBD 6.9 mm. [facility] consulted, they recommend general surgery consultation for cholecystectomy with IOC and no MRCP or ERCP because her LFTs are improving.  I had already ordered HIDA scan and patient is down for HIDA scan already.  I have also consulted general surgery and they will see her.  Patient's pain is improved.  Smoking cessation  Current smoker at 0.5 PPD, interested in quitting.  - Counseled on  cessation. Nicotine patch 7 mg, RX at discharge  I have discussed tobacco cessation with the patient.  I have counseled the patient regarding the negative impacts of continued tobacco use including but not limited to lung cancer, COPD, and cardiovascular disease.  I have discussed alternatives to tobacco and modalities that may help facilitate tobacco cessation including but not limited to biofeedback, hypnosis, and medications.  Total time spent with tobacco counseling was 5 minutes.  Incidental findings: Multiple hepatic cysts   Chronic medical problems: Hyperlipidemia: Not on cholesterol-lowering agents but lipid panel done more than a year ago shows high triglyceride of 212 so I will repeat lipid panel today to rule out hypertriglyceridemia as a potential cause of pancreatitis as well. B12 deficiency: Noted outpatient follow-up  DVT prophylaxis: SCDs Start: 01/13/24 0102   Code Status: Full Code  Family Communication:  None present at bedside.  Plan of care discussed with patient in length and he/she verbalized understanding and agreed with it.  Status is: Inpatient Remains inpatient appropriate because: Still with pain but likely requires cholecystectomy.   Estimated body mass index is 35.12 kg/m as calculated from the following:   Height as of this encounter: 5\' 2"  (1.575 m).   Weight as of this encounter: 87.1 kg.    Nutritional Assessment: Body mass index is 35.12 kg/m.Aaron Aas Seen by dietician.  I agree with the assessment and plan as outlined below: Nutrition Status:        . Skin Assessment: I have examined the patient's skin and I agree with the wound assessment as performed by the wound care RN as outlined below:    Consultants:  General Surgery and GI  Procedures:  As above  Antimicrobials:  Anti-infectives (From admission, onward)    Start     Dose/Rate Route Frequency Ordered Stop   01/13/24 0400  piperacillin-tazobactam (ZOSYN) IVPB 3.375 g        3.375  g 12.5 mL/hr over 240 Minutes Intravenous Every 8 hours 01/13/24 0104     01/12/24 2000  piperacillin-tazobactam (ZOSYN) IVPB 3.375 g        3.375 g 100 mL/hr over 30 Minutes Intravenous  Once 01/12/24 1950 01/12/24 2058         Subjective: Patient seen and examined, she says that her abdominal pain is much better.  She has no nausea or vomiting.  Objective: Vitals:   01/12/24 2000 01/12/24 2030 01/13/24 0000 01/13/24 0424  BP: (!) 139/99 133/77 101/77 126/62  Pulse: (!) 58 (!) 58 76 (!) 55  Resp: (!) 23 16 18 17   Temp:   98.2 F (36.8 C) 97.9 F (36.6 C)  TempSrc:   Oral Oral  SpO2: 97% 99%  98%  Weight:      Height:        Intake/Output Summary (Last 24 hours) at 01/13/2024 0745 Last data filed at 01/12/2024 2058 Gross per 24 hour  Intake 41.74 ml  Output --  Net 41.74 ml   Filed Weights   01/12/24 1728  Weight: 87.1 kg    Examination:  General exam: Appears calm and comfortable  Respiratory system: Clear to auscultation. Respiratory effort normal. Cardiovascular system: S1 & S2 heard, RRR. No JVD, murmurs, rubs, gallops or clicks. No pedal edema. Gastrointestinal system: Abdomen is nondistended, soft and gastric and right upper quadrant tenderness with positive Murphy's sign. No organomegaly or masses felt. Normal bowel sounds heard. Central nervous system: Alert and oriented. No focal neurological deficits. Extremities: Symmetric 5 x 5 power. Skin: No rashes, lesions or ulcers Psychiatry: Judgement and insight appear normal. Mood & affect appropriate.    Data Reviewed: I have personally reviewed following labs and imaging studies  CBC: Recent Labs  Lab 01/12/24 1732  WBC 12.9*  HGB 14.9  HCT 45.5  MCV 89.2  PLT 331   Basic Metabolic Panel: Recent Labs  Lab 01/12/24 1732  NA 140  K 3.9  CL 103  CO2 24  GLUCOSE 118*  BUN 16  CREATININE 0.60  CALCIUM 10.1   GFR: Estimated Creatinine Clearance: 82.4 mL/min (by C-G formula based on SCr of 0.6  mg/dL). Liver Function Tests: Recent Labs  Lab 01/12/24 1732  AST 209*  ALT 181*  ALKPHOS 189*  BILITOT 0.7  PROT 8.0  ALBUMIN 4.6   Recent Labs  Lab 01/12/24 1732  LIPASE 2,669*   No results for input(s): "AMMONIA" in the last 168 hours. Coagulation Profile: No results for input(s): "INR", "PROTIME" in the last 168 hours. Cardiac Enzymes: No results for input(s): "CKTOTAL", "CKMB", "CKMBINDEX", "TROPONINI" in the last 168 hours. BNP (last 3 results) No results for input(s): "PROBNP" in the last 8760 hours. HbA1C: No results for input(s): "HGBA1C" in the last 72 hours. CBG: No results for input(s): "GLUCAP" in the last 168 hours. Lipid Profile: No results for input(s): "CHOL", "HDL", "LDLCALC", "TRIG", "CHOLHDL", "LDLDIRECT" in the last 72 hours. Thyroid Function Tests: No results for input(s): "TSH", "T4TOTAL", "FREET4", "T3FREE", "THYROIDAB" in the last 72 hours. Anemia Panel: No results for input(s): "VITAMINB12", "FOLATE", "FERRITIN", "TIBC", "IRON", "RETICCTPCT" in the last 72 hours. Sepsis Labs: No results for input(s): "PROCALCITON", "LATICACIDVEN" in the last 168  hours.  No results found for this or any previous visit (from the past 240 hours).   Radiology Studies: US  Abdomen Limited RUQ (LIVER/GB) Result Date: 01/12/2024 CLINICAL DATA:  Elevated LFTs EXAM: ULTRASOUND ABDOMEN LIMITED RIGHT UPPER QUADRANT COMPARISON:  None Available. FINDINGS: Gallbladder: The gallbladder is filled with gallstones measuring to 9 mm. Gallbladder wall thickening is present measuring 4.8 mm. Sonographic Abigail Abler sign is negative. Common bile duct: Diameter: 6.9 mm.  No intrahepatic biliary ductal dilatation. Liver: Multiple hepatic cysts are present. The largest in the left lobe measures 4.4 x 3.2 x 2.2 cm. The largest in the right lobe measures 3.6 x 3.3 x 3.6 cm. Within normal limits in parenchymal echogenicity. Portal vein is patent on color Doppler imaging with normal direction of blood  flow towards the liver. Other: None. IMPRESSION: 1. Cholelithiasis with gallbladder wall thickening. Sonographic Abigail Abler sign is negative. Findings are equivocal for acute cholecystitis. If there is clinical concern for acute cholecystitis, recommend further evaluation with HIDA scan. 2. Common bile duct is mildly dilated measuring 6.9 mm. Recommend correlation with LFTs. 3. Multiple hepatic cysts. Electronically Signed   By: Tyron Gallon M.D.   On: 01/12/2024 19:53    Scheduled Meds:  nicotine  7 mg Transdermal Daily   sodium chloride  flush  3 mL Intravenous Q12H   Continuous Infusions:  lactated ringers 250 mL/hr at 01/13/24 0458   piperacillin-tazobactam (ZOSYN)  IV 3.375 g (01/13/24 0457)     LOS: 1 day   Modena Andes, MD [facility] Total time spent: 39 minutes  01/13/2024, 7:45 AM   *Please note that this is a verbal dictation therefore any spelling or grammatical errors are due to the "[employer]agon [employer] One" system interpretation.  Please page via Amion and do not message via secure chat for urgent patient care matters. Secure chat can be used for non urgent patient care matters.  How to contact the [facility] Attending or Consulting provider 7A - 7P or covering provider during after hours 7P -7A, for this patient?  Check the care team in [facility] and look for a) attending/consulting TR[facility] provider listed and b) the [facility] team listed. Page or secure chat 7A-7P. Log into [email or web address] and use [place]'s universal password to access. If you do not have the password, please contact the hospital operator. Locate the [facility] provider you are looking for under [facility] and page to a number that you can be directly reached. If you still have difficulty reaching the provider, please page the [facility] (Director on Call) for the Hospitalists listed on amion for assistance.

## 2024-01-13 NOTE — Plan of Care (Signed)
  Problem: Clinical Measurements: Goal: Will remain free from infection Outcome: Progressing   Problem: Activity: Goal: Risk for activity intolerance will decrease Outcome: Progressing   Problem: Coping: Goal: Level of anxiety will decrease Outcome: Progressing   Problem: Pain Managment: Goal: General experience of comfort will improve and/or be controlled Outcome: Progressing   Problem: Skin Integrity: Goal: Risk for impaired skin integrity will decrease Outcome: Progressing   Problem: Safety: Goal: Ability to remain free from injury will improve Outcome: Progressing

## 2024-01-14 ENCOUNTER — Encounter (HOSPITAL_COMMUNITY)
Admission: EM | Disposition: A | Discharge status: Home / Self Care | Payer: Self-pay | Source: Other Acute Inpatient Hospital | Attending: Family Medicine

## 2024-01-14 ENCOUNTER — Inpatient Hospital Stay (HOSPITAL_COMMUNITY): Admitting: Anesthesiology

## 2024-01-14 ENCOUNTER — Inpatient Hospital Stay (HOSPITAL_COMMUNITY)

## 2024-01-14 ENCOUNTER — Other Ambulatory Visit: Payer: Self-pay

## 2024-01-14 ENCOUNTER — Encounter (HOSPITAL_COMMUNITY): Payer: Self-pay | Admitting: Internal Medicine

## 2024-01-14 DIAGNOSIS — K851 Biliary acute pancreatitis without necrosis or infection: Secondary | ICD-10-CM | POA: Diagnosis not present

## 2024-01-14 HISTORY — PX: CHOLECYSTECTOMY: SHX55

## 2024-01-14 LAB — CBC WITH DIFFERENTIAL/PLATELET
Abs Immature Granulocytes: 0.03 10*3/uL (ref 0.00–0.07)
Basophils Absolute: 0 10*3/uL (ref 0.0–0.1)
Basophils Relative: 1 %
Eosinophils Absolute: 0.1 10*3/uL (ref 0.0–0.5)
Eosinophils Relative: 2 %
HCT: 38.8 % (ref 36.0–46.0)
Hemoglobin: 13 g/dL (ref 12.0–15.0)
Immature Granulocytes: 0 %
Lymphocytes Relative: 32 %
Lymphs Abs: 2.2 10*3/uL (ref 0.7–4.0)
MCH: 30 pg (ref 26.0–34.0)
MCHC: 33.5 g/dL (ref 30.0–36.0)
MCV: 89.6 fL (ref 80.0–100.0)
Monocytes Absolute: 0.5 10*3/uL (ref 0.1–1.0)
Monocytes Relative: 7 %
Neutro Abs: 4 10*3/uL (ref 1.7–7.7)
Neutrophils Relative %: 58 %
Platelets: 261 10*3/uL (ref 150–400)
RBC: 4.33 MIL/uL (ref 3.87–5.11)
RDW: 12.1 % (ref 11.5–15.5)
WBC: 6.9 10*3/uL (ref 4.0–10.5)
nRBC: 0 % (ref 0.0–0.2)

## 2024-01-14 LAB — COMPREHENSIVE METABOLIC PANEL WITH GFR
ALT: 120 U/L — ABNORMAL HIGH (ref 0–44)
AST: 55 U/L — ABNORMAL HIGH (ref 15–41)
Albumin: 3.4 g/dL — ABNORMAL LOW (ref 3.5–5.0)
Alkaline Phosphatase: 97 U/L (ref 38–126)
Anion gap: 10 (ref 5–15)
BUN: 6 mg/dL (ref 6–20)
CO2: 24 mmol/L (ref 22–32)
Calcium: 8.9 mg/dL (ref 8.9–10.3)
Chloride: 107 mmol/L (ref 98–111)
Creatinine, Ser: 0.68 mg/dL (ref 0.44–1.00)
GFR, Estimated: >60 mL/min (ref >60)
Glucose, Bld: 86 mg/dL (ref 70–99)
Potassium: 3.6 mmol/L (ref 3.5–5.1)
Sodium: 141 mmol/L (ref 135–145)
Total Bilirubin: 0.9 mg/dL (ref 0.0–1.2)
Total Protein: 6.1 g/dL — ABNORMAL LOW (ref 6.5–8.1)

## 2024-01-14 LAB — LIPASE, BLOOD: Lipase: 36 U/L (ref 11–51)

## 2024-01-14 SURGERY — LAPAROSCOPIC CHOLECYSTECTOMY WITH INTRAOPERATIVE CHOLANGIOGRAM
Anesthesia: General | Site: Abdomen

## 2024-01-14 MED ORDER — EPHEDRINE 5 MG/ML INJ
INTRAVENOUS | Status: AC
  Filled 2024-01-14: qty 5

## 2024-01-14 MED ORDER — METOCLOPRAMIDE HCL 5 MG/ML IJ SOLN
5.0000 mg | Freq: Once | INTRAMUSCULAR | Status: AC
  Administered 2024-01-14: 5 mg via INTRAVENOUS

## 2024-01-14 MED ORDER — DEXAMETHASONE SODIUM PHOSPHATE 10 MG/ML IJ SOLN
INTRAMUSCULAR | Status: DC | PRN
  Administered 2024-01-14: 10 mg via INTRAVENOUS

## 2024-01-14 MED ORDER — CHLORHEXIDINE GLUCONATE CLOTH 2 % EX PADS
6.0000 | MEDICATED_PAD | Freq: Once | CUTANEOUS | Status: AC
  Administered 2024-01-14: 6 via TOPICAL

## 2024-01-14 MED ORDER — OXYCODONE HCL 5 MG/5ML PO SOLN: 5.0000 mg | Freq: Once | ORAL | Status: DC | PRN

## 2024-01-14 MED ORDER — SODIUM CHLORIDE 0.9 % IV SOLN
INTRAVENOUS | Status: DC | PRN
  Administered 2024-01-14: 8 mL

## 2024-01-14 MED ORDER — PROPOFOL 10 MG/ML IV BOLUS
INTRAVENOUS | Status: AC
  Filled 2024-01-14: qty 20

## 2024-01-14 MED ORDER — LIDOCAINE 2% (20 MG/ML) 5 ML SYRINGE
INTRAMUSCULAR | Status: DC | PRN
Start: 2024-01-14 — End: 2024-01-14
  Administered 2024-01-14: 60 mg via INTRAVENOUS

## 2024-01-14 MED ORDER — MIDAZOLAM HCL 2 MG/2ML IJ SOLN
INTRAMUSCULAR | Status: AC
  Filled 2024-01-14: qty 2

## 2024-01-14 MED ORDER — BUPIVACAINE-EPINEPHRINE 0.25% -1:200000 IJ SOLN
INTRAMUSCULAR | Status: DC | PRN
  Administered 2024-01-14: 8 mL
  Administered 2024-01-14: 22 mL

## 2024-01-14 MED ORDER — ACETAMINOPHEN 10 MG/ML IV SOLN
INTRAVENOUS | Status: DC | PRN
  Administered 2024-01-14: 1000 mg via INTRAVENOUS

## 2024-01-14 MED ORDER — EPHEDRINE SULFATE-NACL 50-0.9 MG/10ML-% IV SOSY
PREFILLED_SYRINGE | INTRAVENOUS | Status: DC | PRN
  Administered 2024-01-14: 5 mg via INTRAVENOUS
  Administered 2024-01-14: 10 mg via INTRAVENOUS

## 2024-01-14 MED ORDER — MIDAZOLAM HCL 2 MG/2ML IJ SOLN
INTRAMUSCULAR | Status: DC | PRN
  Administered 2024-01-14 (×2): 1 mg via INTRAVENOUS

## 2024-01-14 MED ORDER — KETOROLAC TROMETHAMINE 30 MG/ML IJ SOLN
INTRAMUSCULAR | Status: DC | PRN
  Administered 2024-01-14: 30 mg via INTRAVENOUS

## 2024-01-14 MED ORDER — LACTATED RINGERS IV SOLN: INTRAVENOUS | Status: DC | PRN

## 2024-01-14 MED ORDER — AMISULPRIDE (ANTIEMETIC) 5 MG/2ML IV SOLN
10.0000 mg | Freq: Once | INTRAVENOUS | Status: AC
  Administered 2024-01-14: 10 mg via INTRAVENOUS

## 2024-01-14 MED ORDER — FENTANYL CITRATE (PF) 250 MCG/5ML IJ SOLN
INTRAMUSCULAR | Status: DC | PRN
Start: 2024-01-14 — End: 2024-01-14
  Administered 2024-01-14 (×3): 50 ug via INTRAVENOUS

## 2024-01-14 MED ORDER — GLYCOPYRROLATE PF 0.2 MG/ML IJ SOSY
PREFILLED_SYRINGE | INTRAMUSCULAR | Status: AC
  Filled 2024-01-14: qty 1

## 2024-01-14 MED ORDER — ONDANSETRON HCL 4 MG/2ML IJ SOLN
INTRAMUSCULAR | Status: DC | PRN
  Administered 2024-01-14 (×2): 4 mg via INTRAVENOUS

## 2024-01-14 MED ORDER — GLYCOPYRROLATE 0.2 MG/ML IJ SOLN
INTRAMUSCULAR | Status: DC | PRN
  Administered 2024-01-14: .2 mg via INTRAVENOUS

## 2024-01-14 MED ORDER — 0.9 % SODIUM CHLORIDE (POUR BTL) OPTIME
TOPICAL | Status: DC | PRN
  Administered 2024-01-14: 1000 mL

## 2024-01-14 MED ORDER — SUGAMMADEX SODIUM 200 MG/2ML IV SOLN
INTRAVENOUS | Status: DC | PRN
  Administered 2024-01-14: 200 mg via INTRAVENOUS

## 2024-01-14 MED ORDER — ACETAMINOPHEN 500 MG PO TABS
1000.0000 mg | ORAL_TABLET | Freq: Four times a day (QID) | ORAL | Status: DC
  Administered 2024-01-14 – 2024-01-15 (×3): 1000 mg via ORAL
  Filled 2024-01-14 (×5): qty 2

## 2024-01-14 MED ORDER — ROCURONIUM BROMIDE 10 MG/ML (PF) SYRINGE
PREFILLED_SYRINGE | INTRAVENOUS | Status: DC | PRN
  Administered 2024-01-14: 60 mg via INTRAVENOUS
  Administered 2024-01-14: 5 mg via INTRAVENOUS

## 2024-01-14 MED ORDER — ACETAMINOPHEN 10 MG/ML IV SOLN
INTRAVENOUS | Status: AC
  Filled 2024-01-14: qty 100

## 2024-01-14 MED ORDER — DEXAMETHASONE SODIUM PHOSPHATE 10 MG/ML IJ SOLN
INTRAMUSCULAR | Status: AC
  Filled 2024-01-14: qty 1

## 2024-01-14 MED ORDER — OXYCODONE HCL 5 MG PO TABS: 5.0000 mg | ORAL_TABLET | Freq: Once | ORAL | Status: DC | PRN

## 2024-01-14 MED ORDER — AMISULPRIDE (ANTIEMETIC) 5 MG/2ML IV SOLN
INTRAVENOUS | Status: AC
  Filled 2024-01-14: qty 4

## 2024-01-14 MED ORDER — LIDOCAINE 2% (20 MG/ML) 5 ML SYRINGE
INTRAMUSCULAR | Status: AC
  Filled 2024-01-14: qty 5

## 2024-01-14 MED ORDER — FENTANYL CITRATE (PF) 100 MCG/2ML IJ SOLN
25.0000 ug | INTRAMUSCULAR | Status: DC | PRN
Start: 2024-01-14 — End: 2024-01-14

## 2024-01-14 MED ORDER — SUCCINYLCHOLINE CHLORIDE 200 MG/10ML IV SOSY
PREFILLED_SYRINGE | INTRAVENOUS | Status: AC
  Filled 2024-01-14: qty 10

## 2024-01-14 MED ORDER — PHENYLEPHRINE 80 MCG/ML (10ML) SYRINGE FOR IV PUSH (FOR BLOOD PRESSURE SUPPORT)
PREFILLED_SYRINGE | INTRAVENOUS | Status: DC | PRN
  Administered 2024-01-14: 80 ug via INTRAVENOUS

## 2024-01-14 MED ORDER — SODIUM CHLORIDE 0.9 % IR SOLN
Status: DC | PRN
  Administered 2024-01-14: 1000 mL

## 2024-01-14 MED ORDER — ACETAMINOPHEN 10 MG/ML IV SOLN: 1000.0000 mg | Freq: Once | INTRAVENOUS | Status: DC | PRN

## 2024-01-14 MED ORDER — ROCURONIUM BROMIDE 10 MG/ML (PF) SYRINGE
PREFILLED_SYRINGE | INTRAVENOUS | Status: AC
  Filled 2024-01-14: qty 10

## 2024-01-14 MED ORDER — ONDANSETRON HCL 4 MG/2ML IJ SOLN
INTRAMUSCULAR | Status: AC
  Filled 2024-01-14: qty 2

## 2024-01-14 MED ORDER — PROMETHAZINE (PHENERGAN) 6.25MG IN NS 50ML IVPB
6.2500 mg | Freq: Once | INTRAVENOUS | Status: AC
Start: 2024-01-14 — End: 2024-01-14
  Administered 2024-01-14: 6.25 mg via INTRAVENOUS
  Filled 2024-01-14: qty 6.25

## 2024-01-14 MED ORDER — CHLORHEXIDINE GLUCONATE 0.12 % MT SOLN
15.0000 mL | Freq: Once | OROMUCOSAL | Status: AC
  Administered 2024-01-14: 15 mL via OROMUCOSAL

## 2024-01-14 MED ORDER — BUPIVACAINE-EPINEPHRINE (PF) 0.25% -1:200000 IJ SOLN
INTRAMUSCULAR | Status: AC
  Filled 2024-01-14: qty 30

## 2024-01-14 MED ORDER — LACTATED RINGERS IV SOLN: INTRAVENOUS | Status: DC

## 2024-01-14 MED ORDER — PROPOFOL 10 MG/ML IV BOLUS
INTRAVENOUS | Status: DC | PRN
  Administered 2024-01-14: 130 mg via INTRAVENOUS

## 2024-01-14 MED ORDER — FENTANYL CITRATE (PF) 250 MCG/5ML IJ SOLN
INTRAMUSCULAR | Status: AC
Start: 2024-01-14 — End: ?
  Filled 2024-01-14: qty 5

## 2024-01-14 MED ORDER — OXYCODONE HCL 5 MG PO TABS: 5.0000 mg | ORAL_TABLET | Freq: Four times a day (QID) | ORAL | Status: DC | PRN

## 2024-01-14 MED ORDER — ORAL CARE MOUTH RINSE: 15.0000 mL | Freq: Once | OROMUCOSAL | Status: AC

## 2024-01-14 MED ORDER — METOCLOPRAMIDE HCL 5 MG/ML IJ SOLN
INTRAMUSCULAR | Status: AC
  Filled 2024-01-14: qty 2

## 2024-01-14 SURGICAL SUPPLY — 42 items
BAG COUNTER SPONGE SURGICOUNT (BAG) ×1 IMPLANT
CANISTER SUCTION 3000ML PPV (SUCTIONS) ×1 IMPLANT
CATH URETL OPEN END 6FR 70 (CATHETERS) IMPLANT
CHLORAPREP W/TINT 26 (MISCELLANEOUS) ×1 IMPLANT
CLIP APPLIE ROT 10 11.4 M/L (STAPLE) ×1 IMPLANT
COVER MAYO STAND STRL (DRAPES) ×1 IMPLANT
COVER SURGICAL LIGHT HANDLE (MISCELLANEOUS) ×1 IMPLANT
DERMABOND ADVANCED .7 DNX12 (GAUZE/BANDAGES/DRESSINGS) ×1 IMPLANT
DRAPE C-ARM 42X120 X-RAY (DRAPES) ×1 IMPLANT
ELECTRODE REM PT RTRN 9FT ADLT (ELECTROSURGICAL) ×1 IMPLANT
ENDOLOOP SUT PDS II 0 18 (SUTURE) IMPLANT
GLOVE BIO SURGEON STRL SZ7 (GLOVE) ×1 IMPLANT
GOWN STRL REUS W/ TWL LRG LVL3 (GOWN DISPOSABLE) ×2 IMPLANT
GOWN STRL REUS W/ TWL XL LVL3 (GOWN DISPOSABLE) ×1 IMPLANT
GRASPER SUT TROCAR 14GX15 (MISCELLANEOUS) ×1 IMPLANT
IRRIGATION SUCT STRKRFLW 2 WTP (MISCELLANEOUS) ×1 IMPLANT
KIT BASIN OR (CUSTOM PROCEDURE TRAY) ×1 IMPLANT
KIT IMAGING PINPOINTPAQ (MISCELLANEOUS) IMPLANT
KIT TURNOVER KIT B (KITS) ×1 IMPLANT
LHOOK LAP DISP 36CM (ELECTROSURGICAL) ×1 IMPLANT
NDL 22X1.5 STRL (OR ONLY) (MISCELLANEOUS) ×1 IMPLANT
NDL INSUFFLATION 14GA 120MM (NEEDLE) ×1 IMPLANT
NEEDLE 22X1.5 STRL (OR ONLY) (MISCELLANEOUS) ×1 IMPLANT
NEEDLE INSUFFLATION 14GA 120MM (NEEDLE) ×1 IMPLANT
NS IRRIG 1000ML POUR BTL (IV SOLUTION) ×1 IMPLANT
PAD ARMBOARD POSITIONER FOAM (MISCELLANEOUS) ×1 IMPLANT
PENCIL BUTTON HOLSTER BLD 10FT (ELECTRODE) ×1 IMPLANT
POUCH RETRIEVAL ECOSAC 10 (ENDOMECHANICALS) ×1 IMPLANT
SCISSORS LAP 5X35 DISP (ENDOMECHANICALS) ×1 IMPLANT
SET CHOLANGIOGRAPH 5 50 .035 (SET/KITS/TRAYS/PACK) ×1 IMPLANT
SET TUBE SMOKE EVAC HIGH FLOW (TUBING) ×1 IMPLANT
SLEEVE Z-THREAD 5X100MM (TROCAR) ×2 IMPLANT
SPECIMEN JAR SMALL (MISCELLANEOUS) ×1 IMPLANT
STOPCOCK 4 WAY LG BORE MALE ST (IV SETS) IMPLANT
SUT MNCRL AB 4-0 PS2 18 (SUTURE) ×1 IMPLANT
TOWEL GREEN STERILE (TOWEL DISPOSABLE) ×1 IMPLANT
TOWEL GREEN STERILE FF (TOWEL DISPOSABLE) ×1 IMPLANT
TRAY LAPAROSCOPIC MC (CUSTOM PROCEDURE TRAY) ×1 IMPLANT
TROCAR Z THREAD OPTICAL 12X100 (TROCAR) ×1 IMPLANT
TROCAR Z-THREAD OPTICAL 5X100M (TROCAR) ×1 IMPLANT
WARMER LAPAROSCOPE (MISCELLANEOUS) ×1 IMPLANT
WATER STERILE IRR 1000ML POUR (IV SOLUTION) ×1 IMPLANT

## 2024-01-14 NOTE — Anesthesia Procedure Notes (Signed)
 Procedure Name: Intubation Date/Time: 01/14/2024 11:28 AM  Performed by: Alisia Apple, CRNAPre-anesthesia Checklist: Patient identified, Emergency Drugs available, Suction available and Patient being monitored Patient Re-evaluated:Patient Re-evaluated prior to induction Oxygen Delivery Method: Circle system utilized Preoxygenation: Pre-oxygenation with 100% oxygen Induction Type: IV induction Ventilation: Mask ventilation without difficulty Laryngoscope Size: Mac and 3 Grade View: Grade II Tube type: Oral Number of attempts: 1 Airway Equipment and Method: Stylet Placement Confirmation: ETT inserted through vocal cords under direct vision, positive ETCO2 and breath sounds checked- equal and bilateral Secured at: 22 cm Tube secured with: Tape Dental Injury: Teeth and Oropharynx as per pre-operative assessment  Comments: intubated by C. Corbin Falck, CRNA; ebbs

## 2024-01-14 NOTE — Op Note (Signed)
 Patient: Becky King MRN: [id number] DOB: February 13, 1971 Sex: female Operation/Procedure Date: 01/14/2024  Surgeons and Role:    * Davonna Estes Willeen Harold, MD - Primary  Pre-operative Diagnoses: Gallstone pancreatitis Postoperative Diagnoses: Cholecystitis   Procedure performed: Laparoscopic cholecystectomy with intra-operative cholangiogram  Anesthesia: General endotracheal anesthesia  Indications: [name] is a 53 year old female who presented to the ED with several days of abdominal pain. Her workup was consistent with gallstone pancreatitis. Her abdominal pain improved, and I offered to take her to the operating room for cholecystectomy with IOC. Preoperatively, I discussed in detail the risks, benefits, alternatives, and potential complications. The patient understands and requests to proceed.  Operative Findings: No filling defects appreciate on intraoperative cholangiogram.  Operative Narrative: The patient was positively identified and was taken to the operating room and placed supine on the operating table. A time-out was performed confirming correct patient and procedure. We also confirmed initiation of deep venous thrombosis prophylaxis and wound prophylaxis. After successful induction of general endotracheal anesthesia, the arms were carefully padded. An orogastric tube and footboard were placed. The abdomen was prepped and draped in the usual sterile surgical fashion.  We began out access with a Veress needle in the LUQ at [place]'s point.  Following a aspiration of air and a positive saline drop test, the insufflation tubing was connected and the abdomen brought to a pressure of . A 5-mm port was placed right of the umbilicus using an opti-view technique. The abdomen was inspected and there were no signs of injury from access.  We placed three additional ports all under direct vision, a 10-mm port in the subxiphoid position, one 5-mm port in the right midclavicular  line, and one right subcostal port in the anterior axillary line. The patient was placed in the head up position and tilted slightly to the left. The dome of the gallbladder was grasped, elevated, and retracted anteriorly and cephalad. The infundibulum was retracted laterally and inferiorly exposing Calot's triangle. The investing visceral peritoneal attachments overlying the infundibulum of the gallbladder were dissected free from the gallbladder itself. We soon developed two structures into the gallbladder consistent with the cystic duct and cystic artery. The loose areolar tissue around these structures was dissected free. The gallbladder was separated from the gallbladder fossa for approximately a third the distance up from the cystic plate, establishing the critical view of safety. We then performed a cholangiogram using a cook catheter inserted into the cystic duct and secured with a clip.This revealed adequate length of cystic duct entering into normal caliber common bile duct. The common hepatic duct right and left ducts were normal. There was no evidence of filling defect or stricture distally. There was prompt filling of the duodenum. The catheter was then removed and the cystic duct and cystic artery were triply clipped. These were divided using laparoscopic scissors leaving a single clip on the removal side. The gallbladder was elevated off the gallbladder fossa using the hook Bovie. The gallbladder was then exteriorized through the subxiphoid position using a specimen bag. We reestablished pneumoperitoneum and confirmed no leakage of blood or bile. The subhepatic space was irrigated with warm sterile saline and suctioned free. The subxiphoid port was removed and the defect closed with an interrupted 0 vicryl on a suture passer.  The other ports were removed under direct vision and the abdomen was desufflated. We placed 0.25% Marcaine plain at each incision site for local anesthesia. The skin was closed  using 4-0 Monocryl subcuticular suture. Dermabond  was applied. The patient tolerated the procedure well, was extubated, and taken to the recovery room.  Estimated Blood Loss: Minimal Specimens: Gallbladder Implants: None Drains: None Complications: None Condition of the patient: Good, extubated Disposition: PACU   [name] Date: 01/14/2024 Time: 12:50 PM

## 2024-01-14 NOTE — Progress Notes (Signed)
 * Day of Surgery *  Subjective: Denies new complaints. Denies abdominal pain. Lipase 36 yesterday.   ROS: See above, otherwise other systems negative  Objective: Vital signs in last 24 hours: Temp:  [97.7 F (36.5 C)-98.5 F (36.9 C)] (P) 98.5 F (36.9 C) (05/24 0807) Pulse Rate:  [55-79] (P) 60 (05/24 0807) Resp:  [16-17] (P) 17 (05/24 0807) BP: (124-147)/(75-84) (P) 131/65 (05/24 0807) SpO2:  [94 %-98 %] (P) 94 % (05/24 0807) Last BM Date : 01/12/24  Intake/Output from previous day: 05/23 0701 - 05/24 0700 In: 300 [P.O.:150; IV Piggyback:150] Out: -  Intake/Output this shift: No intake/output data recorded.  PE: Gen: female, NAD Abd: soft, non-distended, non-tender  Lab Results:  Recent Labs    01/13/24 0738 01/14/24 0249  WBC 6.8 6.9  HGB 13.5 13.0  HCT 40.7 38.8  PLT 278 261   BMET Recent Labs    01/13/24 0757 01/14/24 0249  NA 142 141  K 4.0 3.6  CL 110 107  CO2 23 24  GLUCOSE 91 86  BUN 7 6  CREATININE 0.62 0.68  CALCIUM 9.1 8.9   PT/INR Recent Labs    01/13/24 0738  LABPROT 13.5  INR 1.0   CMP     Component Value Date/Time   NA 141 01/14/2024 0249   K 3.6 01/14/2024 0249   CL 107 01/14/2024 0249   CO2 24 01/14/2024 0249   GLUCOSE 86 01/14/2024 0249   BUN 6 01/14/2024 0249   CREATININE 0.68 01/14/2024 0249   CREATININE 0.60 10/18/2022 1211   CALCIUM 8.9 01/14/2024 0249   PROT 6.1 (L) 01/14/2024 0249   PROT 6.7 05/02/2019 1020   ALBUMIN 3.4 (L) 01/14/2024 0249   AST 55 (H) 01/14/2024 0249   ALT 120 (H) 01/14/2024 0249   ALKPHOS 97 01/14/2024 0249   BILITOT 0.9 01/14/2024 0249   GFRNONAA >60 01/14/2024 0249   GFRAA >60 06/05/2017 1358   Lipase     Component Value Date/Time   LIPASE 36 01/14/2024 0249    Studies/Results: NM Hepatobiliary Liver Func Result Date: 01/13/2024 CLINICAL DATA:  Acute abdominal pain. EXAM: NUCLEAR MEDICINE HEPATOBILIARY IMAGING TECHNIQUE: Sequential images of the abdomen were obtained out to  60 minutes following intravenous administration of radiopharmaceutical. RADIOPHARMACEUTICALS:  5.0 mCi Tc-37m  Choletec IV COMPARISON:  Ultrasound Jan 12, 2024 FINDINGS: Prompt uptake and biliary excretion of activity by the liver is seen. Gallbladder activity is visualized, consistent with patency of cystic duct. Biliary activity passes into small bowel, consistent with patent common bile duct. IMPRESSION: Gallbladder is visualized compatible with cystic duct patency. Electronically Signed   By: Tama Fails M.D.   On: 01/13/2024 15:46   US  Abdomen Limited RUQ (LIVER/GB) Result Date: 01/12/2024 CLINICAL DATA:  Elevated LFTs EXAM: ULTRASOUND ABDOMEN LIMITED RIGHT UPPER QUADRANT COMPARISON:  None Available. FINDINGS: Gallbladder: The gallbladder is filled with gallstones measuring to 9 mm. Gallbladder wall thickening is present measuring 4.8 mm. Sonographic Abigail Abler sign is negative. Common bile duct: Diameter: 6.9 mm.  No intrahepatic biliary ductal dilatation. Liver: Multiple hepatic cysts are present. The largest in the left lobe measures 4.4 x 3.2 x 2.2 cm. The largest in the right lobe measures 3.6 x 3.3 x 3.6 cm. Within normal limits in parenchymal echogenicity. Portal vein is patent on color Doppler imaging with normal direction of blood flow towards the liver. Other: None. IMPRESSION: 1. Cholelithiasis with gallbladder wall thickening. Sonographic Abigail Abler sign is negative. Findings are equivocal for acute cholecystitis. If there  is clinical concern for acute cholecystitis, recommend further evaluation with HIDA scan. 2. Common bile duct is mildly dilated measuring 6.9 mm. Recommend correlation with LFTs. 3. Multiple hepatic cysts. Electronically Signed   By: Tyron Gallon M.D.   On: 01/12/2024 19:53    Anti-infectives: Anti-infectives (From admission, onward)    Start     Dose/Rate Route Frequency Ordered Stop   01/13/24 0400  [MAR Hold]  piperacillin-tazobactam (ZOSYN) IVPB 3.375 g        (MAR Hold  since Sat 01/14/2024 at 0814.Hold Reason: Transfer to a Procedural area)   3.375 g 12.5 mL/hr over 240 Minutes Intravenous Every 8 hours 01/13/24 0104     01/12/24 2000  piperacillin-tazobactam (ZOSYN) IVPB 3.375 g        3.375 g 100 mL/hr over 30 Minutes Intravenous  Once 01/12/24 1950 01/12/24 2058       Assessment/Plan 53 y/o F w/ gallstone pancreatitis and possible cholecystitis   - Will proceed to the OR for planned cholecystectomy and IOC. We discussed the alternatives and potential risks of surgery, including but not limited to: bleeding, infection, damage to bowel or surrounding structures, bile leak, damage to the biliary system, pancreatitis, retained stone, and need for additional procedures. All questions were addressed and consent was obtained.     LOS: 2 days   Trula Gable Surgery 01/14/2024, 10:44 AM Please see Amion for pager number during day hours 7:00am-4:30pm or 7:00am -11:30am on weekends

## 2024-01-14 NOTE — Progress Notes (Signed)
 PROGRESS NOTE    Becky King  ZOX:[id number] DOB: 08/24/1970 DOA: 01/12/2024 PCP: Pcp, No   Brief Narrative:  Becky King is a 53 y.o. female with hx of hyperlipidemia, B12 deficiency, abdominal surgeries including prior hernia repair, appendectomy, salpingectomy, who is transferred from [facility] ED due to gallstone pancreatitis.   She reports that over the past year she has had rare episodes of severe epigastric pain accompanied by nausea and vomiting.  Typically these episodes are short lasting in the range of hours.  With months in between episodes.  However since Saturday 5/17 she has had more persistent pain, intolerance of p.o.'s with nausea and vomiting.  Pain is currently in the epigastrium and right upper quadrant and it radiates to her back.  Denies any current or prior alcohol use associated with episodes.  No history of gallstones/gallbladder disease to her knowledge.     Assessment & Plan:   Principal Problem:   Gallstone pancreatitis Active Problems:   Encounter for smoking cessation counseling  Acute gallstone pancreatitis / Mild dilation of CBD, question underlying choledocholithiasis and possible acute cholecystitis, POA: Cholelithiasis with clinical history of episodic biliary colic over the past year.  Acute worsening of pain over the past 5 days prior to admission. T. bili 0.7, AST 209, ALT 181, alk phos 189, lipase 2669.  Right upper quadrant ultrasound notable for cholelithiasis, gallbladder wall thickening equivocal for cholecystitis, mild dilation in CBD 6.9 mm. [facility] consulted, they recommend general surgery consultation for cholecystectomy with IOC and no MRCP or ERCP because her LFTs are improving.  Patient's status post laparoscopic cholecystectomy with IOC.  No filling defects appreciated on IOC.  Lipase normalized.  No leukocytosis.  LFTs significantly improved.  Will repeat CMP in the morning.  Smoking cessation  Current smoker at 0.5 PPD,  interested in quitting.  - Counseled on cessation. Nicotine patch 7 mg, RX at discharge  I have discussed tobacco cessation with the patient.  I have counseled the patient regarding the negative impacts of continued tobacco use including but not limited to lung cancer, COPD, and cardiovascular disease.  I have discussed alternatives to tobacco and modalities that may help facilitate tobacco cessation including but not limited to biofeedback, hypnosis, and medications.  Total time spent with tobacco counseling was 5 minutes.  Incidental findings: Multiple hepatic cysts   Chronic medical problems: Hyperlipidemia: Not on cholesterol-lowering agents but lipid panel done more than a year ago shows high triglyceride of 212 so I will repeat lipid panel today to rule out hypertriglyceridemia as a potential cause of pancreatitis as well. B12 deficiency: Noted outpatient follow-up  DVT prophylaxis: SCDs Start: 01/13/24 0102   Code Status: Full Code  Family Communication:  None present at bedside.  Plan of care discussed with patient in length and he/she verbalized understanding and agreed with it.  Status is: Inpatient Remains inpatient appropriate because: Underwent surgery today, needs further observation and monitoring of LFTs  Estimated body mass index is 35.12 kg/m as calculated from the following:   Height as of this encounter: 5\' 2"  (1.575 m).   Weight as of this encounter: 87.1 kg.    Nutritional Assessment: Body mass index is 35.12 kg/m.Aaron Aas Seen by dietician.  I agree with the assessment and plan as outlined below: Nutrition Status:        . Skin Assessment: I have examined the patient's skin and I agree with the wound assessment as performed by the wound care RN as outlined below:  Consultants:  General Surgery and GI  Procedures:  As above  Antimicrobials:  Anti-infectives (From admission, onward)    Start     Dose/Rate Route Frequency Ordered Stop   01/13/24 0400   piperacillin-tazobactam (ZOSYN) IVPB 3.375 g        3.375 g 12.5 mL/hr over 240 Minutes Intravenous Every 8 hours 01/13/24 0104     01/12/24 2000  piperacillin-tazobactam (ZOSYN) IVPB 3.375 g        3.375 g 100 mL/hr over 30 Minutes Intravenous  Once 01/12/24 1950 01/12/24 2058         Subjective: Seen and examined post surgery.  Expected abdominal pain was reported.  No other complaint.  Objective: Vitals:   01/14/24 1430 01/14/24 1445 01/14/24 1500 01/14/24 1530  BP: (!) 111/51 111/82 111/62 (!) 91/57  Pulse: (!) 51 (!) 50 (!) 48 (!) 57  Resp: 15 17 13 16   Temp:   97.6 F (36.4 C) (!) 97.5 F (36.4 C)  TempSrc:    Oral  SpO2: 95% 93% 92% 96%  Weight:      Height:        Intake/Output Summary (Last 24 hours) at 01/14/2024 1733 Last data filed at 01/14/2024 1307 Gross per 24 hour  Intake 1400 ml  Output 25 ml  Net 1375 ml   Filed Weights   01/12/24 1728  Weight: 87.1 kg    Examination:  General exam: Appears calm and comfortable  Respiratory system: Clear to auscultation. Respiratory effort normal. Cardiovascular system: S1 & S2 heard, RRR. No JVD, murmurs, rubs, gallops or clicks. No pedal edema. Gastrointestinal system: Abdomen is nondistended, soft and generalized abdominal tenderness more pronounced at laparoscopy sites, expected. No organomegaly or masses felt. Normal bowel sounds heard. Central nervous system: Alert and oriented. No focal neurological deficits. Extremities: Symmetric 5 x 5 power. Skin: No rashes, lesions or ulcers.  Psychiatry: Judgement and insight appear normal. Mood & affect appropriate.   Data Reviewed: I have personally reviewed following labs and imaging studies  CBC: Recent Labs  Lab 01/12/24 1732 01/13/24 0738 01/14/24 0249  WBC 12.9* 6.8 6.9  NEUTROABS  --   --  4.0  HGB 14.9 13.5 13.0  HCT 45.5 40.7 38.8  MCV 89.2 88.5 89.6  PLT 331 278 261   Basic Metabolic Panel: Recent Labs  Lab 01/12/24 1732 01/13/24 0738  01/13/24 0757 01/14/24 0249  NA 140  --  142 141  K 3.9  --  4.0 3.6  CL 103  --  110 107  CO2 24  --  23 24  GLUCOSE 118*  --  91 86  BUN 16  --  7 6  CREATININE 0.60  --  0.62 0.68  CALCIUM 10.1  --  9.1 8.9  MG  --  2.0  --   --   PHOS  --  3.8  --   --    GFR: Estimated Creatinine Clearance: 82.4 mL/min (by C-G formula based on SCr of 0.68 mg/dL). Liver Function Tests: Recent Labs  Lab 01/12/24 1732 01/13/24 0757 01/14/24 0249  AST 209* 139* 55*  ALT 181* 169* 120*  ALKPHOS 189* 117 97  BILITOT 0.7 0.9 0.9  PROT 8.0 6.1* 6.1*  ALBUMIN 4.6 3.4* 3.4*   Recent Labs  Lab 01/12/24 1732 01/13/24 1458 01/14/24 0249  LIPASE 2,669* 36 36   No results for input(s): "AMMONIA" in the last 168 hours. Coagulation Profile: Recent Labs  Lab 01/13/24 0738  INR 1.0  Cardiac Enzymes: No results for input(s): "CKTOTAL", "CKMB", "CKMBINDEX", "TROPONINI" in the last 168 hours. BNP (last 3 results) No results for input(s): "PROBNP" in the last 8760 hours. HbA1C: No results for input(s): "HGBA1C" in the last 72 hours. CBG: No results for input(s): "GLUCAP" in the last 168 hours. Lipid Profile: Recent Labs    01/13/24 0738  CHOL 151  HDL 36*  LDLCALC 92  TRIG 161  CHOLHDL 4.2   Thyroid Function Tests: No results for input(s): "TSH", "T4TOTAL", "FREET4", "T3FREE", "THYROIDAB" in the last 72 hours. Anemia Panel: No results for input(s): "VITAMINB12", "FOLATE", "FERRITIN", "TIBC", "IRON", "RETICCTPCT" in the last 72 hours. Sepsis Labs: No results for input(s): "PROCALCITON", "LATICACIDVEN" in the last 168 hours.  Recent Results (from the past 240 hours)  Surgical PCR screen     Status: None   Collection Time: 01/13/24  9:04 PM   Specimen: Nasal Mucosa; Nasal Swab  Result Value Ref Range Status   MRSA, PCR NEGATIVE NEGATIVE Final   Staphylococcus aureus NEGATIVE NEGATIVE Final    Comment: (NOTE) The Xpert SA Assay (FDA approved for NASAL specimens in patients  58 years of age and older), is one component of a comprehensive surveillance program. It is not intended to diagnose infection nor to guide or monitor treatment. Performed at [facility], [address], [place], [place] [address]      Radiology Studies: DG Cholangiogram Operative Result Date: 01/14/2024 CLINICAL DATA:  Elective surgery, laparoscopic cholecystectomy. EXAM: INTRAOPERATIVE CHOLANGIOGRAM TECHNIQUE: Cholangiographic images from the C-arm fluoroscopic device were submitted for interpretation post-operatively. Please see the procedural report for the amount of contrast and the fluoroscopy time utilized. FLUOROSCOPY: Radiation Exposure Index (as provided by the fluoroscopic device): 8.96 mGy Kerma COMPARISON:  None Available. FINDINGS: Eight fluoroscopic spot views during intraoperative cholangiogram. Contrast injected through the cystic duct. Contrast opacifies the common hepatic, common bile and to a lesser extent intrahepatic ducts. Contrast flows into the duodenum without evidence of choledocholithiasis. Fluoroscopy time 30 seconds. IMPRESSION: Intraoperative cholangiogram without evidence of choledocholithiasis. Electronically Signed   By: Chadwick Colonel M.D.   On: 01/14/2024 16:10   NM Hepatobiliary Liver Func Result Date: 01/13/2024 CLINICAL DATA:  Acute abdominal pain. EXAM: NUCLEAR MEDICINE HEPATOBILIARY IMAGING TECHNIQUE: Sequential images of the abdomen were obtained out to 60 minutes following intravenous administration of radiopharmaceutical. RADIOPHARMACEUTICALS:  5.0 mCi Tc-33m  Choletec IV COMPARISON:  Ultrasound Jan 12, 2024 FINDINGS: Prompt uptake and biliary excretion of activity by the liver is seen. Gallbladder activity is visualized, consistent with patency of cystic duct. Biliary activity passes into small bowel, consistent with patent common bile duct. IMPRESSION: Gallbladder is visualized compatible with cystic duct patency. Electronically Signed   By:  Tama Fails M.D.   On: 01/13/2024 15:46   US  Abdomen Limited RUQ (LIVER/GB) Result Date: 01/12/2024 CLINICAL DATA:  Elevated LFTs EXAM: ULTRASOUND ABDOMEN LIMITED RIGHT UPPER QUADRANT COMPARISON:  None Available. FINDINGS: Gallbladder: The gallbladder is filled with gallstones measuring to 9 mm. Gallbladder wall thickening is present measuring 4.8 mm. Sonographic Abigail Abler sign is negative. Common bile duct: Diameter: 6.9 mm.  No intrahepatic biliary ductal dilatation. Liver: Multiple hepatic cysts are present. The largest in the left lobe measures 4.4 x 3.2 x 2.2 cm. The largest in the right lobe measures 3.6 x 3.3 x 3.6 cm. Within normal limits in parenchymal echogenicity. Portal vein is patent on color Doppler imaging with normal direction of blood flow towards the liver. Other: None. IMPRESSION: 1. Cholelithiasis with gallbladder wall  thickening. Sonographic Abigail Abler sign is negative. Findings are equivocal for acute cholecystitis. If there is clinical concern for acute cholecystitis, recommend further evaluation with HIDA scan. 2. Common bile duct is mildly dilated measuring 6.9 mm. Recommend correlation with LFTs. 3. Multiple hepatic cysts. Electronically Signed   By: Tyron Gallon M.D.   On: 01/12/2024 19:53    Scheduled Meds:  acetaminophen   1,000 mg Oral Q6H   nicotine  7 mg Transdermal Daily   sodium chloride  flush  3 mL Intravenous Q12H   Continuous Infusions:  piperacillin-tazobactam (ZOSYN)  IV 3.375 g (01/14/24 1623)     LOS: 2 days   Modena Andes, MD [facility] Total time spent: 39 minutes  01/14/2024, 5:33 PM   *Please note that this is a verbal dictation therefore any spelling or grammatical errors are due to the "[employer] One" system interpretation.  Please page via Amion and do not message via secure chat for urgent patient care matters. Secure chat can be used for non urgent patient care matters.  How to contact the [facility] Attending or Consulting provider 7A -  7P or covering provider during after hours 7P -7A, for this patient?  Check the care team in [facility] and look for a) attending/consulting [facility] provider listed and b) the [facility] team listed. Page or secure chat 7A-7P. Log into [email or web address] and use [facility]'s universal password to access. If you do not have the password, please contact the hospital operator. Locate the [facility] provider you are looking for under [facility] and page to a number that you can be directly reached. If you still have difficulty reaching the provider, please page the [facility] (Director on Call) for the Hospitalists listed on amion for assistance.

## 2024-01-14 NOTE — Plan of Care (Signed)

## 2024-01-14 NOTE — Progress Notes (Signed)
 Pt OTF transported to surgery

## 2024-01-14 NOTE — Anesthesia Preprocedure Evaluation (Signed)
 Anesthesia Evaluation  Patient identified by MRN, date of birth, ID band Patient awake    Reviewed: Allergy & Precautions, NPO status , Patient's Chart, lab work & pertinent test results  History of Anesthesia Complications (+) PONV and history of anesthetic complications  Airway Mallampati: II  TM Distance: >3 FB Neck ROM: Full    Dental  (+) Dental Advisory Given, Poor Dentition, Missing,    Pulmonary neg shortness of breath, asthma , neg sleep apnea, neg recent URI, Current Smoker and Patient abstained from smoking.   breath sounds clear to auscultation       Cardiovascular negative cardio ROS  Rhythm:Regular     Neuro/Psych negative neurological ROS  negative psych ROS   GI/Hepatic negative GI ROS, Neg liver ROS,,,  Endo/Other  negative endocrine ROS    Renal/GU negative Renal ROSLab Results      Component                Value               Date                      NA                       141                 01/14/2024                K                        3.6                 01/14/2024                CO2                      24                  01/14/2024                GLUCOSE                  86                  01/14/2024                BUN                      6                   01/14/2024                CREATININE               0.68                01/14/2024                CALCIUM                  8.9                 01/14/2024                GFRNONAA                 >60  01/14/2024                Musculoskeletal negative musculoskeletal ROS (+)    Abdominal   Peds  Hematology negative hematology ROS (+) Lab Results      Component                Value               Date                      WBC                      6.9                 01/14/2024                HGB                      13.0                01/14/2024                HCT                      38.8                 01/14/2024                MCV                      89.6                01/14/2024                PLT                      261                 01/14/2024              Anesthesia Other Findings   Reproductive/Obstetrics                             Anesthesia Physical Anesthesia Plan  ASA: 2  Anesthesia Plan: General   Post-op Pain Management: Toradol IV (intra-op)* and Ofirmev  IV (intra-op)*   Induction: Intravenous  PONV Risk Score and Plan: 4 or greater and Ondansetron and Dexamethasone  Airway Management Planned: Oral ETT  Additional Equipment: None  Intra-op Plan:   Post-operative Plan: Extubation in OR  Informed Consent: I have reviewed the patients History and Physical, chart, labs and discussed the procedure including the risks, benefits and alternatives for the proposed anesthesia with the patient or authorized representative who has indicated his/her understanding and acceptance.     Dental advisory given  Plan Discussed with: CRNA  Anesthesia Plan Comments:        Anesthesia Quick Evaluation

## 2024-01-14 NOTE — Progress Notes (Signed)
 Pt give IS and reinforced teaching on how to use device. Pt demonstrated how to use device properly. SCD's on. Pt currently eating, bed in lowest position and call bell within reach. Pt's sons at bedside.

## 2024-01-14 NOTE — Transfer of Care (Signed)
 Immediate Anesthesia Transfer of Care Note  Patient: Becky King  Procedure(s) Performed: LAPAROSCOPIC CHOLECYSTECTOMY WITH INTRAOPERATIVE CHOLANGIOGRAM (Abdomen)  Patient Location: PACU  Anesthesia Type:General  Level of Consciousness: awake and alert    Airway & Oxygen Therapy: Patient Spontanous Breathing and Patient connected to nasal cannula oxygen  Post-op Assessment: Report given to RN and Post -op Vital signs reviewed and stable  Post vital signs: Reviewed and stable  Last Vitals:  Vitals Value Taken Time  BP 136/68 01/14/24 1303  Temp    Pulse 50 01/14/24 1307  Resp 19 01/14/24 1307  SpO2 92 % 01/14/24 1307  Vitals shown include unfiled device data. Pt states 0 pain but is c/o nausea.     Complications: No notable events documented.

## 2024-01-15 DIAGNOSIS — K851 Biliary acute pancreatitis without necrosis or infection: Secondary | ICD-10-CM | POA: Diagnosis not present

## 2024-01-15 LAB — COMPREHENSIVE METABOLIC PANEL WITH GFR
ALT: 92 U/L — ABNORMAL HIGH (ref 0–44)
AST: 41 U/L (ref 15–41)
Albumin: 3.3 g/dL — ABNORMAL LOW (ref 3.5–5.0)
Alkaline Phosphatase: 79 U/L (ref 38–126)
Anion gap: 9 (ref 5–15)
BUN: 10 mg/dL (ref 6–20)
CO2: 21 mmol/L — ABNORMAL LOW (ref 22–32)
Calcium: 8.9 mg/dL (ref 8.9–10.3)
Chloride: 108 mmol/L (ref 98–111)
Creatinine, Ser: 0.65 mg/dL (ref 0.44–1.00)
GFR, Estimated: >60 mL/min (ref >60)
Glucose, Bld: 125 mg/dL — ABNORMAL HIGH (ref 70–99)
Potassium: 3.9 mmol/L (ref 3.5–5.1)
Sodium: 138 mmol/L (ref 135–145)
Total Bilirubin: 0.6 mg/dL (ref 0.0–1.2)
Total Protein: 6.1 g/dL — ABNORMAL LOW (ref 6.5–8.1)

## 2024-01-15 LAB — CBC WITH DIFFERENTIAL/PLATELET
Abs Immature Granulocytes: 0.04 10*3/uL (ref 0.00–0.07)
Basophils Absolute: 0 10*3/uL (ref 0.0–0.1)
Basophils Relative: 0 %
Eosinophils Absolute: 0 10*3/uL (ref 0.0–0.5)
Eosinophils Relative: 0 %
HCT: 38 % (ref 36.0–46.0)
Hemoglobin: 12.8 g/dL (ref 12.0–15.0)
Immature Granulocytes: 0 %
Lymphocytes Relative: 10 %
Lymphs Abs: 1 10*3/uL (ref 0.7–4.0)
MCH: 29.8 pg (ref 26.0–34.0)
MCHC: 33.7 g/dL (ref 30.0–36.0)
MCV: 88.4 fL (ref 80.0–100.0)
Monocytes Absolute: 0.6 10*3/uL (ref 0.1–1.0)
Monocytes Relative: 6 %
Neutro Abs: 9 10*3/uL — ABNORMAL HIGH (ref 1.7–7.7)
Neutrophils Relative %: 84 %
Platelets: 268 10*3/uL (ref 150–400)
RBC: 4.3 MIL/uL (ref 3.87–5.11)
RDW: 12.1 % (ref 11.5–15.5)
WBC: 10.6 10*3/uL — ABNORMAL HIGH (ref 4.0–10.5)
nRBC: 0 % (ref 0.0–0.2)

## 2024-01-15 LAB — LIPASE, BLOOD: Lipase: 27 U/L (ref 11–51)

## 2024-01-15 MED ORDER — OXYCODONE HCL 5 MG PO CAPS: 5.0000 mg | ORAL_CAPSULE | ORAL | 0 refills | Status: DC | PRN

## 2024-01-15 NOTE — Progress Notes (Signed)
   01/15/24 1134  AVS Discharge Documentation  AVS Discharge Instructions Including Medications Provided to patient/caregiver  Name of Person Receiving AVS Discharge Instructions Including Medications Becky King  Name of Clinician That Reviewed AVS Discharge Instructions Including Medications Synthia Ewing RN   AVS reviewed with patient all questions answered. All personal belongings were returned.

## 2024-01-15 NOTE — Discharge Instructions (Signed)

## 2024-01-15 NOTE — Plan of Care (Signed)
°  Problem: Education: Goal: Knowledge of General Education information will improve Description: Including pain rating scale, medication(s)/side effects and non-pharmacologic comfort measures Outcome: Progressing   Problem: Health Behavior/Discharge Planning: Goal: Ability to manage health-related needs will improve Outcome: Progressing   Problem: Clinical Measurements: Goal: Ability to maintain clinical measurements within normal limits will improve Outcome: Progressing   Problem: Activity: Goal: Risk for activity intolerance will decrease Outcome: Progressing   Problem: Nutrition: Goal: Adequate nutrition will be maintained Outcome: Progressing   Problem: Safety: Goal: Ability to remain free from injury will improve Outcome: Progressing

## 2024-01-15 NOTE — Discharge Summary (Signed)
 Physician Discharge Summary  Becky King:[id number] DOB: [birth year] DOA: 01/12/2024  PCP: Pcp, No  Admit date: 01/12/2024 Discharge date: 01/15/2024 30 Day Unplanned Readmission Risk Score    Flowsheet Row ED to Hosp-Admission (Current) from 01/12/2024 in [facility]  SURGICAL  30 Day Unplanned Readmission Risk Score (%) 8.11 Filed at 01/15/2024 0801       This score is the patient's risk of an unplanned readmission within 30 days of being discharged (0 -100%). The score is based on dignosis, age, lab data, medications, orders, and past utilization.   Low:  0-14.9   Medium: 15-21.9   High: 22-29.9   Extreme: 30 and above          Admitted From: Home Disposition: Home  Recommendations for Outpatient Follow-up:  Follow up with PCP in 1-2 weeks Please obtain BMP/CBC in one week Follow-up with general surgery in 2 weeks Please follow up with your PCP on the following pending results: Unresulted Labs (From admission, onward)    None         Home Health: None Equipment/Devices: None  Discharge Condition: STABLE CODE STATUS: Full code Diet recommendation: Cardiac  Subjective: Seen and examined, feeling well, other than mild and expected pain at the laparoscopy sites, she has no other complaint and she is tolerating diet.  Brief/Interim Summary: Becky King is a 53 y.o. female with hx of hyperlipidemia, B12 deficiency, abdominal surgeries including prior hernia repair, appendectomy, salpingectomy, who presented with abdominal pain and admitted to hospital service for gallstone pancreatitis.  Details below.  Acute gallstone pancreatitis / Mild dilation of CBD, question underlying choledocholithiasis and possible acute cholecystitis, POA: Cholelithiasis with clinical history of episodic biliary colic over the past year.  Acute worsening of pain over the past 5 days prior to admission. T. bili 0.7, AST 209, ALT 181, alk phos 189, lipase  2669.  Right upper quadrant ultrasound notable for cholelithiasis, gallbladder wall thickening equivocal for cholecystitis, mild dilation in CBD 6.9 mm. [facility] consulted, they recommend general surgery consultation for cholecystectomy with IOC and no MRCP or ERCP because her LFTs were improving.  Patient eventually underwent laparoscopic cholecystectomy with [facility] 01/14/2024.  No filling defects appreciated on IOC.  Lipase normalized.  No leukocytosis.  LFTs significantly improved.  Cleared by GI and general surgery for discharge.  Pain medications sent.   Smoking cessation  Current smoker at 0.5 PPD, interested in quitting.  - Counseled on cessation. Nicotine patch 7 mg, RX at discharge  I have discussed tobacco cessation with the patient.  I have counseled the patient regarding the negative impacts of continued tobacco use including but not limited to lung cancer, COPD, and cardiovascular disease.  I have discussed alternatives to tobacco and modalities that may help facilitate tobacco cessation including but not limited to biofeedback, hypnosis, and medications.  Total time spent with tobacco counseling was 5 minutes.   Incidental findings: Multiple hepatic cysts   Chronic medical problems: Hyperlipidemia: Not on cholesterol-lowering agents but lipid panel done more than a year ago shows high triglyceride of 212 so we repeated lipid panel and triglycerides were normal. B12 deficiency: Noted outpatient follow-up  Discharge plan was discussed with patient and/or family member and they verbalized understanding and agreed with it.  Discharge Diagnoses:  Principal Problem:   Gallstone pancreatitis Active Problems:   Encounter for smoking cessation counseling    Discharge Instructions   Allergies as of 01/15/2024       Reactions  Latex Itching, Rash, Other (See Comments)   Cause blisters also        Medication List     TAKE these medications    albuterol  108 (90 Base) MCG/ACT  inhaler Commonly known as: VENTOLIN  HFA Inhale 1-2 puffs into the lungs every 6 (six) hours as needed for wheezing or shortness of breath.   clobetasol cream 0.05 % Commonly known as: TEMOVATE Apply 1 Application topically 2 (two) times daily as needed (psoriasis).   estradiol  0.1 MG/GM vaginal cream Commonly known as: ESTRACE  VAGINAL Place 1 g vaginally 2 (two) times a week. What changed:  when to take this additional instructions   eucerin lotion Apply 1 Application topically as needed for dry skin.   fexofenadine 180 MG tablet Commonly known as: ALLEGRA Take 180 mg by mouth daily.   fluticasone  50 MCG/ACT nasal spray Commonly known as: FLONASE  Place 1 spray into both nostrils daily as needed for allergies or rhinitis.   metoCLOPramide 10 MG tablet Commonly known as: REGLAN Take 10 mg by mouth in the morning, at noon, in the evening, and at bedtime.   MIDOL  COMPLETE PO Take 1 tablet by mouth 2 (two) times daily as needed (pain).   oxycodone  5 MG capsule Commonly known as: OXY-IR Take 1 capsule (5 mg total) by mouth every 4 (four) hours as needed.   polyvinyl alcohol 1.4 % ophthalmic solution Commonly known as: LIQUIFILM TEARS Place 1 drop into both eyes as needed for dry eyes. [employer] For dry eyes   VITAMIN B 12 PO Take 1 tablet by mouth daily.        Follow-up Information     Maczis, Puja Gosai, PA-C. Call.   Specialty: General Surgery Why: Call to confirm appointment date/time in ~3 weeks for post-operative follow up Contact information: [address] [facility] [place] [address] [phone]         PCP Follow up in 1 week(s).                 Allergies  Allergen Reactions   Latex Itching, Rash and Other (See Comments)    Cause blisters also    Consultations: General surgery and GI   Procedures/Studies: DG Cholangiogram Operative Result Date: 01/14/2024 CLINICAL DATA:  Elective surgery,  laparoscopic cholecystectomy. EXAM: INTRAOPERATIVE CHOLANGIOGRAM TECHNIQUE: Cholangiographic images from the C-arm fluoroscopic device were submitted for interpretation post-operatively. Please see the procedural report for the amount of contrast and the fluoroscopy time utilized. FLUOROSCOPY: Radiation Exposure Index (as provided by the fluoroscopic device): 8.96 mGy Kerma COMPARISON:  None Available. FINDINGS: Eight fluoroscopic spot views during intraoperative cholangiogram. Contrast injected through the cystic duct. Contrast opacifies the common hepatic, common bile and to a lesser extent intrahepatic ducts. Contrast flows into the duodenum without evidence of choledocholithiasis. Fluoroscopy time 30 seconds. IMPRESSION: Intraoperative cholangiogram without evidence of choledocholithiasis. Electronically Signed   By: Chadwick Colonel M.D.   On: 01/14/2024 16:10   NM Hepatobiliary Liver Func Result Date: 01/13/2024 CLINICAL DATA:  Acute abdominal pain. EXAM: NUCLEAR MEDICINE HEPATOBILIARY IMAGING TECHNIQUE: Sequential images of the abdomen were obtained out to 60 minutes following intravenous administration of radiopharmaceutical. RADIOPHARMACEUTICALS:  5.0 mCi Tc-74m  Choletec IV COMPARISON:  Ultrasound Jan 12, 2024 FINDINGS: Prompt uptake and biliary excretion of activity by the liver is seen. Gallbladder activity is visualized, consistent with patency of cystic duct. Biliary activity passes into small bowel, consistent with patent common bile duct. IMPRESSION: Gallbladder is visualized compatible with  cystic duct patency. Electronically Signed   By: Tama Fails M.D.   On: 01/13/2024 15:46   US  Abdomen Limited RUQ (LIVER/GB) Result Date: 01/12/2024 CLINICAL DATA:  Elevated LFTs EXAM: ULTRASOUND ABDOMEN LIMITED RIGHT UPPER QUADRANT COMPARISON:  None Available. FINDINGS: Gallbladder: The gallbladder is filled with gallstones measuring to 9 mm. Gallbladder wall thickening is present measuring 4.8 mm.  Sonographic Abigail Abler sign is negative. Common bile duct: Diameter: 6.9 mm.  No intrahepatic biliary ductal dilatation. Liver: Multiple hepatic cysts are present. The largest in the left lobe measures 4.4 x 3.2 x 2.2 cm. The largest in the right lobe measures 3.6 x 3.3 x 3.6 cm. Within normal limits in parenchymal echogenicity. Portal vein is patent on color Doppler imaging with normal direction of blood flow towards the liver. Other: None. IMPRESSION: 1. Cholelithiasis with gallbladder wall thickening. Sonographic Abigail Abler sign is negative. Findings are equivocal for acute cholecystitis. If there is clinical concern for acute cholecystitis, recommend further evaluation with HIDA scan. 2. Common bile duct is mildly dilated measuring 6.9 mm. Recommend correlation with LFTs. 3. Multiple hepatic cysts. Electronically Signed   By: Tyron Gallon M.D.   On: 01/12/2024 19:53     Discharge Exam: Vitals:   01/15/24 0519 01/15/24 0715  BP: (!) 123/58 136/63  Pulse: 96 (!) 51  Resp: 18 16  Temp: (!) 97.5 F (36.4 C) 97.9 F (36.6 C)  SpO2: 94% 93%   Vitals:   01/14/24 2011 01/15/24 0040 01/15/24 0519 01/15/24 0715  BP: (!) 106/48 111/82 (!) 123/58 136/63  Pulse: 64 72 96 (!) 51  Resp: 18 18 18 16   Temp: 98.1 F (36.7 C) 98.1 F (36.7 C) (!) 97.5 F (36.4 C) 97.9 F (36.6 C)  TempSrc: Oral Oral Oral Oral  SpO2: 93% 95% 94% 93%  Weight:      Height:        General: Pt is alert, awake, not in acute distress Cardiovascular: RRR, S1/S2 +, no rubs, no gallops Respiratory: CTA bilaterally, no wheezing, no rhonchi Abdominal: Soft, NT, ND, bowel sounds + Extremities: no edema, no cyanosis    The results of significant diagnostics from this hospitalization (including imaging, microbiology, ancillary and laboratory) are listed below for reference.     Microbiology: Recent Results (from the past 240 hours)  Surgical PCR screen     Status: None   Collection Time: 01/13/24  9:04 PM   Specimen: Nasal  Mucosa; Nasal Swab  Result Value Ref Range Status   MRSA, PCR NEGATIVE NEGATIVE Final   Staphylococcus aureus NEGATIVE NEGATIVE Final    Comment: (NOTE) The Xpert SA Assay (FDA approved for NASAL specimens in patients 55 years of age and older), is one component of a comprehensive surveillance program. It is not intended to diagnose infection nor to guide or monitor treatment. Performed at [facility], [address] [place], [place] [address]      Labs: BNP (last 3 results) No results for input(s): "BNP" in the last 8760 hours. Basic Metabolic Panel: Recent Labs  Lab 01/12/24 1732 01/13/24 0738 01/13/24 0757 01/14/24 0249 01/15/24 0444  NA 140  --  142 141 138  K 3.9  --  4.0 3.6 3.9  CL 103  --  110 107 108  CO2 24  --  23 24 21*  GLUCOSE 118*  --  91 86 125*  BUN 16  --  7 6 10   CREATININE 0.60  --  0.62 0.68 0.65  CALCIUM 10.1  --  9.1 8.9 8.9  MG  --  2.0  --   --   --   PHOS  --  3.8  --   --   --    Liver Function Tests: Recent Labs  Lab 01/12/24 1732 01/13/24 0757 01/14/24 0249 01/15/24 0444  AST 209* 139* 55* 41  ALT 181* 169* 120* 92*  ALKPHOS 189* 117 97 79  BILITOT 0.7 0.9 0.9 0.6  PROT 8.0 6.1* 6.1* 6.1*  ALBUMIN 4.6 3.4* 3.4* 3.3*   Recent Labs  Lab 01/12/24 1732 01/13/24 1458 01/14/24 0249 01/15/24 0444  LIPASE 2,669* 36 36 27   No results for input(s): "AMMONIA" in the last 168 hours. CBC: Recent Labs  Lab 01/12/24 1732 01/13/24 0738 01/14/24 0249 01/15/24 0444  WBC 12.9* 6.8 6.9 10.6*  NEUTROABS  --   --  4.0 9.0*  HGB 14.9 13.5 13.0 12.8  HCT 45.5 40.7 38.8 38.0  MCV 89.2 88.5 89.6 88.4  PLT 331 278 261 268   Cardiac Enzymes: No results for input(s): "CKTOTAL", "CKMB", "CKMBINDEX", "TROPONINI" in the last 168 hours. BNP: Invalid input(s): "POCBNP" CBG: No results for input(s): "GLUCAP" in the last 168 hours. D-Dimer No results for input(s): "DDIMER" in the last 72 hours. Hgb A1c No results for input(s):  "HGBA1C" in the last 72 hours. Lipid Profile Recent Labs    01/13/24 0738  CHOL 151  HDL 36*  LDLCALC 92  TRIG 147  CHOLHDL 4.2   Thyroid function studies No results for input(s): "TSH", "T4TOTAL", "T3FREE", "THYROIDAB" in the last 72 hours.  Invalid input(s): "FREET3" Anemia work up No results for input(s): "VITAMINB12", "FOLATE", "FERRITIN", "TIBC", "IRON", "RETICCTPCT" in the last 72 hours. Urinalysis    Component Value Date/Time   COLORURINE YELLOW 01/12/2024 1732   APPEARANCEUR CLEAR 01/12/2024 1732   LABSPEC 1.024 01/12/2024 1732   PHURINE 7.0 01/12/2024 1732   GLUCOSEU NEGATIVE 01/12/2024 1732   HGBUR NEGATIVE 01/12/2024 1732   BILIRUBINUR NEGATIVE 01/12/2024 1732   KETONESUR NEGATIVE 01/12/2024 1732   PROTEINUR NEGATIVE 01/12/2024 1732   NITRITE NEGATIVE 01/12/2024 1732   LEUKOCYTESUR NEGATIVE 01/12/2024 1732   Sepsis Labs Recent Labs  Lab 01/12/24 1732 01/13/24 0738 01/14/24 0249 01/15/24 0444  WBC 12.9* 6.8 6.9 10.6*   Microbiology Recent Results (from the past 240 hours)  Surgical PCR screen     Status: None   Collection Time: 01/13/24  9:04 PM   Specimen: Nasal Mucosa; Nasal Swab  Result Value Ref Range Status   MRSA, PCR NEGATIVE NEGATIVE Final   Staphylococcus aureus NEGATIVE NEGATIVE Final    Comment: (NOTE) The Xpert SA Assay (FDA approved for NASAL specimens in patients 56 years of age and older), is one component of a comprehensive surveillance program. It is not intended to diagnose infection nor to guide or monitor treatment. Performed at [facility], [address] [place], [place] [address]     FURTHER DISCHARGE INSTRUCTIONS:   Get Medicines reviewed and adjusted: Please take all your medications with you for your next visit with your Primary MD   Laboratory/radiological data: Please request your Primary MD to go over all hospital tests and procedure/radiological results at the follow up, please ask your Primary MD to get  all Hospital records sent to his/her office.   In some cases, they will be blood work, cultures and biopsy results pending at the time of your discharge. Please request that your primary care M.D. goes through all the records of your hospital data  and follows up on these results.   Also Note the following: If you experience worsening of your admission symptoms, develop shortness of breath, life threatening emergency, suicidal or homicidal thoughts you must seek medical attention immediately by calling 911 or calling your MD immediately  if symptoms less severe.   You must read complete instructions/literature along with all the possible adverse reactions/side effects for all the Medicines you take and that have been prescribed to you. Take any new Medicines after you have completely understood and accpet all the possible adverse reactions/side effects.    Do not drive when taking Pain medications or sleeping medications (Benzodaizepines)   Do not take more than prescribed Pain, Sleep and Anxiety Medications. It is not advisable to combine anxiety,sleep and pain medications without talking with your primary care practitioner   Special Instructions: If you have smoked or chewed Tobacco  in the last 2 yrs please stop smoking, stop any regular Alcohol  and or any Recreational drug use.   Wear Seat belts while driving.   Please note: You were cared for by a hospitalist during your hospital stay. Once you are discharged, your primary care physician will handle any further medical issues. Please note that NO REFILLS for any discharge medications will be authorized once you are discharged, as it is imperative that you return to your primary care physician (or establish a relationship with a primary care physician if you do not have one) for your post hospital discharge needs so that they can reassess your need for medications and monitor your lab values  Time coordinating discharge: Over 30  minutes  SIGNED:   Modena Andes, MD  [facility] 01/15/2024, 10:17 AM *Please note that this is a verbal dictation therefore any spelling or grammatical errors are due to the "Dragon [employer] One" system interpretation. If 7PM-7AM, please contact night-coverage [email or web address]

## 2024-01-15 NOTE — Progress Notes (Signed)
  Subjective No acute events. Feeling well. No complaints. Tolerating diet without n/v.   Objective: Vital signs in last 24 hours: Temp:  [97.5 F (36.4 C)-98.1 F (36.7 C)] 97.9 F (36.6 C) (05/25 0715) Pulse Rate:  [48-96] 51 (05/25 0715) Resp:  [13-23] 16 (05/25 0715) BP: (91-136)/(48-82) 136/63 (05/25 0715) SpO2:  [91 %-96 %] 93 % (05/25 0715) Last BM Date : 01/14/24  Intake/Output from previous day: 05/24 0701 - 05/25 0700 In: 1300 [P.O.:200; I.V.:1000; IV Piggyback:100] Out: 25 [Blood:25] Intake/Output this shift: No intake/output data recorded.  Gen: NAD, comfortable CV: RRR Pulm: Normal work of breathing Abd: Soft, appropriate tenderness; nondistended.  Incisions are clean/dry. Ext: SCDs in place  Lab Results: CBC  Recent Labs    01/14/24 0249 01/15/24 0444  WBC 6.9 10.6*  HGB 13.0 12.8  HCT 38.8 38.0  PLT 261 268   BMET Recent Labs    01/14/24 0249 01/15/24 0444  NA 141 138  K 3.6 3.9  CL 107 108  CO2 24 21*  GLUCOSE 86 125*  BUN 6 10  CREATININE 0.68 0.65  CALCIUM 8.9 8.9   PT/INR Recent Labs    01/13/24 0738  LABPROT 13.5  INR 1.0   ABG No results for input(s): "PHART", "HCO3" in the last 72 hours.  Invalid input(s): "PCO2", "PO2"  Studies/Results:  Anti-infectives: Anti-infectives (From admission, onward)    Start     Dose/Rate Route Frequency Ordered Stop   01/13/24 0400  piperacillin-tazobactam (ZOSYN) IVPB 3.375 g        3.375 g 12.5 mL/hr over 240 Minutes Intravenous Every 8 hours 01/13/24 0104     01/12/24 2000  piperacillin-tazobactam (ZOSYN) IVPB 3.375 g        3.375 g 100 mL/hr over 30 Minutes Intravenous  Once 01/12/24 1950 01/12/24 2058        Assessment/Plan: Patient Active Problem List   Diagnosis Date Noted   Encounter for smoking cessation counseling 01/13/2024   Gallstone pancreatitis 01/12/2024   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   LOS: 3 days    Beatris Lincoln, MD [facility], A [facility]

## 2024-01-16 NOTE — Anesthesia Postprocedure Evaluation (Signed)
 Anesthesia Post Note  Patient: Becky King  Procedure(s) Performed: LAPAROSCOPIC CHOLECYSTECTOMY WITH INTRAOPERATIVE CHOLANGIOGRAM (Abdomen)     Patient location during evaluation: PACU Anesthesia Type: General Level of consciousness: awake and alert Pain management: pain level controlled Vital Signs Assessment: post-procedure vital signs reviewed and stable Respiratory status: spontaneous breathing, nonlabored ventilation and respiratory function stable Cardiovascular status: blood pressure returned to baseline and stable Postop Assessment: no apparent nausea or vomiting Anesthetic complications: no   There were no known notable events for this encounter.               Zaela Graley

## 2024-01-17 ENCOUNTER — Encounter (HOSPITAL_COMMUNITY): Payer: Self-pay | Admitting: General Surgery

## 2024-01-18 LAB — SURGICAL PATHOLOGY

## 2024-01-31 ENCOUNTER — Ambulatory Visit: Admitting: Family Medicine

## 2024-02-16 ENCOUNTER — Ambulatory Visit: Admitting: Family Medicine

## 2024-03-12 ENCOUNTER — Ambulatory Visit: Admitting: Family Medicine

## 2024-03-14 ENCOUNTER — Encounter (HOSPITAL_COMMUNITY): Payer: Self-pay | Admitting: Emergency Medicine

## 2024-03-14 ENCOUNTER — Emergency Department (HOSPITAL_COMMUNITY)

## 2024-03-14 ENCOUNTER — Emergency Department (HOSPITAL_COMMUNITY): Admission: EM | Admit: 2024-03-14 | Discharge: 2024-03-14 | Disposition: A | Discharge status: Home / Self Care

## 2024-03-14 ENCOUNTER — Other Ambulatory Visit: Payer: Self-pay

## 2024-03-14 DIAGNOSIS — M79643 Pain in unspecified hand: Secondary | ICD-10-CM

## 2024-03-14 DIAGNOSIS — W109XXA Fall (on) (from) unspecified stairs and steps, initial encounter: Secondary | ICD-10-CM | POA: Diagnosis not present

## 2024-03-14 DIAGNOSIS — M25511 Pain in right shoulder: Secondary | ICD-10-CM | POA: Insufficient documentation

## 2024-03-14 DIAGNOSIS — Z91040 Latex allergy status: Secondary | ICD-10-CM | POA: Diagnosis not present

## 2024-03-14 DIAGNOSIS — M79641 Pain in right hand: Secondary | ICD-10-CM | POA: Diagnosis not present

## 2024-03-14 MED ORDER — OXYCODONE-ACETAMINOPHEN 5-325 MG PO TABS
1.0000 | ORAL_TABLET | Freq: Once | ORAL | Status: AC
  Administered 2024-03-14: 1 via ORAL
  Filled 2024-03-14: qty 1

## 2024-03-14 NOTE — Discharge Instructions (Signed)
 May take over-the-counter pain medication such as Tylenol  and ibuprofen  for pain.  You may wear the sling for comfort, but please try to move your shoulder frequently as prolonged immobilization can lead to adhesive capsulitis which is also known as frozen shoulder.  We would like you to follow-up with the orthopedic doctor regarding your wrist pain.  Please remain in the splint until you are seen by them.  Please call to schedule an appointment to be seen within the next week.  Return for fevers, chills, sudden onset headache, vision loss, chest pain, shortness of breath, abdominal pain, nausea vomiting or any new or worsening symptoms that are concerning to you.

## 2024-03-14 NOTE — ED Provider Notes (Signed)
 [place] EMERGENCY DEPARTMENT AT [facility] Provider Note   CSN: [id number] Arrival date & time: 03/14/24  9078     Patient presents with: No chief complaint on file.   Becky King is a 53 y.o. female.   Is a 53 year old female presenting emergency department with right shoulder pain after fall.  Was walking down the stairs, fell forward roughly 2-3 steps from the bottom.  Fell forward.  Tried to break her fall with her right arm.  Did hit her head, no LOC.  Not on blood thinners.  Has some burning sensation to abrasions on her face, but no headache, vision loss, painful EOM.  No trismus.  Not having neck pain, chest pain, abdominal pain.  No numbness tingling changes in sensation.        Prior to Admission medications   Medication Sig Start Date End Date Taking? Authorizing Provider  Acetaminophen -Caff-Pyrilamine (MIDOL  COMPLETE PO) Take 1 tablet by mouth 2 (two) times daily as needed (pain).    [provider]  albuterol  (VENTOLIN  HFA) 108 (90 Base) MCG/ACT inhaler Inhale 1-2 puffs into the lungs every 6 (six) hours as needed for wheezing or shortness of breath.    [provider]  clobetasol cream (TEMOVATE) 0.05 % Apply 1 Application topically 2 (two) times daily as needed (psoriasis). 11/08/23   [provider]  Cyanocobalamin  (VITAMIN B 12 PO) Take 1 tablet by mouth daily.    [provider]  Emollient (EUCERIN) lotion Apply 1 Application topically as needed for dry skin.    [provider]  estradiol  (ESTRACE  VAGINAL) 0.1 MG/GM vaginal cream Place 1 g vaginally 2 (two) times a week. Patient taking differently: Place 1 g vaginally See admin instructions. Twice a week if needed for dryness 10/18/22   Prentiss Riggs A, NP  fexofenadine (ALLEGRA) 180 MG tablet Take 180 mg by mouth daily.    [provider]  fluticasone  (FLONASE ) 50 MCG/ACT nasal spray Place 1 spray into both nostrils daily as needed for  allergies or rhinitis.    [provider]  metoCLOPramide  (REGLAN ) 10 MG tablet Take 10 mg by mouth in the morning, at noon, in the evening, and at bedtime. Patient not taking: Reported on 01/13/2024 01/09/24 01/19/24  [provider]  oxycodone  (OXY-IR) 5 MG capsule Take 1 capsule (5 mg total) by mouth every 4 (four) hours as needed. 01/15/24   Vernon Ranks, MD  polyvinyl alcohol (LIQUIFILM TEARS) 1.4 % ophthalmic solution Place 1 drop into both eyes as needed for dry eyes. [employer] Lomb For dry eyes    [provider]    Allergies: Latex    Review of Systems  Updated Vital Signs BP 95/76 (BP Location: Left Arm)   Pulse 76   Temp (!) 97.5 F (36.4 C) (Oral)   Resp 16   SpO2 100%   Physical Exam Vitals and nursing note reviewed.  HENT:     Head: Normocephalic.     Comments: Superficial abrasions forehead, bridge of nose.  No septal hematoma.  Pain-free EOM.  Pupils equal reactive to light.  No TMJ tenderness.    Mouth/Throat:     Mouth: Mucous membranes are moist.  Neck:     Comments: No midline spinal tenderness.  Full pain-free ROM to neck Cardiovascular:     Pulses: Normal pulses.  Pulmonary:     Effort: Pulmonary effort is normal.     Breath sounds: Normal breath sounds.  Abdominal:  General: Abdomen is flat. There is no distension.     Palpations: Abdomen is soft.     Tenderness: There is no abdominal tenderness. There is no guarding or rebound.  Musculoskeletal:     Comments: Chest wall stable nontender.  Pelvis stable nontender.  No midline spinal tenderness.  Normal sensation in all extremities.  No bony tenderness to her left upper extremity or bilateral lower extremities.  Right upper extremity diffusely tender around shoulder.  No tenderness to the elbow, normal grip strength.  Does have some anatomic snuffbox tenderness.  Skin:    General: Skin is warm and dry.     Capillary Refill: Capillary refill takes less than 2 seconds.   Neurological:     General: No focal deficit present.     Mental Status: She is alert and oriented to person, place, and time.  Psychiatric:        Mood and Affect: Mood normal.        Behavior: Behavior normal.     (all labs ordered are listed, but only abnormal results are displayed) Labs Reviewed - No data to display  EKG: None  Radiology: DG Chest 2 View Result Date: 03/14/2024 EXAM: 2 VIEW(S) XRAY OF THE CHEST 03/14/2024 12:10:00 PM COMPARISON: Single-view chest earlier today. CLINICAL HISTORY: f/u LLL findings. Fall, scaphoid tenderness. F/U LLL findings FINDINGS: LUNGS AND PLEURA: The inferolateral left hemithorax opacity is less impressive and favored to be due to overlying breast tissue, new since 2018. No correlate on the lateral view. No pneumothorax. HEART AND MEDIASTINUM: No acute abnormality of the cardiac and mediastinal silhouettes. BONES AND SOFT TISSUES: Osteopenia. Osseous irregularity involving the proximal right humerus was better evaluated on dedicated radiographs. No acute osseous abnormality. IMPRESSION: 1. The plain film abnormality of earlier today is favored to represent overlying breast tissue. No correlate on the lateral view. If the patient has respiratory symptoms, radiographic follow-up could be performed in 5 to 7 days to exclude unlikely mild airspace disease . Electronically signed by: Rockey Kilts MD 03/14/2024 12:17 PM EDT RP Workstation: [id number]   DG Wrist Complete Right Result Date: 03/14/2024 CLINICAL DATA:  Fall.  Right wrist pain. EXAM: RIGHT WRIST - COMPLETE 3+ VIEW COMPARISON:  None Available. FINDINGS: No acute fracture or dislocation. The bones are osteopenic. No significant arthritic changes. The soft tissues are unremarkable. IMPRESSION: 1. No acute fracture or dislocation. 2. Osteopenia. Electronically Signed   By: Vanetta Chou M.D.   On: 03/14/2024 12:16   DG Shoulder Right Result Date: 03/14/2024 CLINICAL DATA:  Right shoulder pain  after fall. EXAM: RIGHT SHOULDER - 2+ VIEW COMPARISON:  None Available. FINDINGS: There is no evidence of fracture or dislocation. There is no evidence of arthropathy or other focal bone abnormality. Soft tissues are unremarkable. IMPRESSION: Negative. Electronically Signed   By: Lynwood Landy Raddle M.D.   On: 03/14/2024 11:15   DG Chest Portable 1 View Result Date: 03/14/2024 EXAM: 1 VIEW XRAY OF THE CHEST 03/14/2024 10:56:00 AM COMPARISON: 12/17/2016 CLINICAL HISTORY: Fall. Reason for exam: fall; Triage notes: Pt BIB [facility] from home for a fall while walking down stairs. Mechanical fall from 3rd step from bottom, landing on concrete on face. Abrasion below right eye and to bridge of nose. Pt also complains of pain to right shoulder. Good PMS to right hand. No thinners. No LOC. Pt began complaining of stiffness to neck en route so towel was placed to protect c-spine. FINDINGS: LUNGS AND PLEURA: Subtle pleural parenchymal opacity in the  inferior left hemithorax. No pneumothorax. HEART AND MEDIASTINUM: Mild cardiomegaly. No acute abnormality of the mediastinal silhouette. BONES AND SOFT TISSUES: No acute osseous abnormality. IMPRESSION: 1. Subtle pleural parenchymal opacity within the inferolateral left hemithorax. Although this could represent overlying breast tissue, pleural fluid, and/or left lower lobe airspace disease cannot be excluded. Consider PA and lateral radiographs. 2. No posttraumatic deformity identified Electronically signed by: Rockey Kilts MD 03/14/2024 11:13 AM EDT RP Workstation: [id number]     Procedures   Medications Ordered in the ED  oxyCODONE -acetaminophen  (PERCOCET/ROXICET) 5-325 MG per tablet 1 tablet (1 tablet Oral Given 03/14/24 1045)                                    Medical Decision Making 53 year old female presenting emergency department with shoulder pain after mechanical fall.  Physical exam largely reassuring.  Has some superficial abrasions to forehead and nose.  Does  not meet imaging criteria based on [place] CT head or C-spine rules.  Given Percocet for pain.  X-rays orders to evaluate for acute osseous trauma.  Will need thumb spica given anatomic snuffbox tenderness and pain to the base of thumb.  X-rays negative for acute fracture.  Placed in sling for comfort.  Placed in thumb spica.  Stable for discharge at this time.  Strict return precautions given.  Amount and/or Complexity of Data Reviewed Labs:     Details: Considered labs, however low suspicion for acute intrathoracic or intra-abdominal pathology.  Mechanical fall.  Labs unlikely to change management or disposition at this time. Radiology: ordered.  Risk Prescription drug management. Decision regarding hospitalization. Diagnosis or treatment significantly limited by social determinants of health. Risk Details: Poor health literacy      Final diagnoses:  Acute pain of right shoulder  Tenderness of anatomical snuffbox    ED Discharge Orders     None          Neysa Caron PARAS, DO 03/14/24 1622

## 2024-03-14 NOTE — ED Triage Notes (Signed)
 Pt BIB [facility] from home for a fall while walking down stairs.  Mechanical fall from 3rd step from bottom, landing on concrete on face.  Abrasion below right eye and to bridge of nose.. pt also complains of pain to right shoulder. Good PMS to right hand.  No thinners.  No LOC.  Pt began complaining of stiffness to neck en route so towel was placed to protect c-spine.   130/90 HR 89, RR 18, 98% Pain 8/10 on shoulder

## 2024-03-23 ENCOUNTER — Emergency Department (HOSPITAL_BASED_OUTPATIENT_CLINIC_OR_DEPARTMENT_OTHER)
Admission: EM | Admit: 2024-03-23 | Discharge: 2024-03-23 | Disposition: A | Discharge status: Home / Self Care | Attending: Emergency Medicine | Admitting: Emergency Medicine

## 2024-03-23 ENCOUNTER — Emergency Department (HOSPITAL_BASED_OUTPATIENT_CLINIC_OR_DEPARTMENT_OTHER)

## 2024-03-23 ENCOUNTER — Other Ambulatory Visit: Payer: Self-pay

## 2024-03-23 ENCOUNTER — Encounter (HOSPITAL_BASED_OUTPATIENT_CLINIC_OR_DEPARTMENT_OTHER): Payer: Self-pay

## 2024-03-23 DIAGNOSIS — Z91040 Latex allergy status: Secondary | ICD-10-CM | POA: Insufficient documentation

## 2024-03-23 DIAGNOSIS — W108XXA Fall (on) (from) other stairs and steps, initial encounter: Secondary | ICD-10-CM | POA: Diagnosis not present

## 2024-03-23 DIAGNOSIS — J45909 Unspecified asthma, uncomplicated: Secondary | ICD-10-CM | POA: Insufficient documentation

## 2024-03-23 DIAGNOSIS — Z7951 Long term (current) use of inhaled steroids: Secondary | ICD-10-CM | POA: Diagnosis not present

## 2024-03-23 DIAGNOSIS — S0083XA Contusion of other part of head, initial encounter: Secondary | ICD-10-CM | POA: Diagnosis not present

## 2024-03-23 DIAGNOSIS — S0990XA Unspecified injury of head, initial encounter: Secondary | ICD-10-CM | POA: Diagnosis present

## 2024-03-23 DIAGNOSIS — S060X0A Concussion without loss of consciousness, initial encounter: Secondary | ICD-10-CM | POA: Insufficient documentation

## 2024-03-23 NOTE — ED Provider Notes (Signed)
 [place] EMERGENCY DEPARTMENT AT [facility] Provider Note   CSN: [id number] Arrival date & time: 03/23/24  1733     Patient presents with: Fall (7/23)   Becky King is a 53 y.o. female.   Patient is a 53 year old female with a history of anemia, asthma, kidney stones who is presenting today with complaint of ongoing headache, neck pain, facial pain after falling down multiple bleacher steps 9 days ago.  She reports she did hit her face on the way down but did not have any loss of consciousness.  She did follow-up in the emergency room where they imaged her right shoulder which was hurting.  At that time no fractures were found.  Subsequently she followed up with orthopedics and they told her that there was actually a fracture and she was currently in a sling.  She and her family have just been very concerned because she has been very irritable, sleepy and fatigued, headaches every day and just not herself.  She denies any vision changes.  She has no throat pain, difficulty swallowing or voice changes.  No chest pain or shortness of breath and no numbness or tingling going down her arms or legs.  The history is provided by the patient and medical records.  Fall       Prior to Admission medications   Medication Sig Start Date End Date Taking? Authorizing Provider  Acetaminophen -Caff-Pyrilamine (MIDOL  COMPLETE PO) Take 1 tablet by mouth 2 (two) times daily as needed (pain).    [provider]  albuterol  (VENTOLIN  HFA) 108 (90 Base) MCG/ACT inhaler Inhale 1-2 puffs into the lungs every 6 (six) hours as needed for wheezing or shortness of breath.    [provider]  clobetasol cream (TEMOVATE) 0.05 % Apply 1 Application topically 2 (two) times daily as needed (psoriasis). 11/08/23   [provider]  Cyanocobalamin  (VITAMIN B 12 PO) Take 1 tablet by mouth daily.    [provider]  Emollient (EUCERIN) lotion Apply 1 Application topically as  needed for dry skin.    [provider]  estradiol  (ESTRACE  VAGINAL) 0.1 MG/GM vaginal cream Place 1 g vaginally 2 (two) times a week. Patient taking differently: Place 1 g vaginally See admin instructions. Twice a week if needed for dryness 10/18/22   Prentiss Riggs A, NP  fexofenadine (ALLEGRA) 180 MG tablet Take 180 mg by mouth daily.    [provider]  fluticasone  (FLONASE ) 50 MCG/ACT nasal spray Place 1 spray into both nostrils daily as needed for allergies or rhinitis.    [provider]  metoCLOPramide  (REGLAN ) 10 MG tablet Take 10 mg by mouth in the morning, at noon, in the evening, and at bedtime. Patient not taking: Reported on 01/13/2024 01/09/24 01/19/24  [provider]  oxycodone  (OXY-IR) 5 MG capsule Take 1 capsule (5 mg total) by mouth every 4 (four) hours as needed. 01/15/24   Vernon Ranks, MD  polyvinyl alcohol (LIQUIFILM TEARS) 1.4 % ophthalmic solution Place 1 drop into both eyes as needed for dry eyes. [employer] For dry eyes    [provider]    Allergies: Latex    Review of Systems  Updated Vital Signs BP (!) 147/69   Pulse 85   Temp (!) 97.1 F (36.2 C)   Resp 16   SpO2 100%   Physical Exam Vitals and nursing note reviewed.  Constitutional:      General: She is not in acute distress.    Appearance:  She is well-developed.  HENT:     Head: Normocephalic. Raccoon eyes and contusion present.     Comments: Healing ecchymosis present over the bridge of the nose and bilateral eyes    Right Ear: Tympanic membrane normal.     Left Ear: Tympanic membrane normal.     Nose:     Right Turbinates: Not swollen.     Left Turbinates: Not swollen.     Mouth/Throat:     Pharynx: Oropharynx is clear. Uvula midline.  Eyes:     Pupils: Pupils are equal, round, and reactive to light.  Cardiovascular:     Rate and Rhythm: Normal rate and regular rhythm.     Heart sounds: Normal heart sounds. No murmur heard.    No friction  rub.  Pulmonary:     Effort: Pulmonary effort is normal.     Breath sounds: Normal breath sounds. No wheezing or rales.  Abdominal:     General: Bowel sounds are normal. There is no distension.     Palpations: Abdomen is soft.     Tenderness: There is no abdominal tenderness. There is no guarding or rebound.  Musculoskeletal:        General: Tenderness present. Normal range of motion.     Cervical back: Spinous process tenderness and muscular tenderness present.     Comments: No edema.  Right arm is in a sling  Skin:    General: Skin is warm and dry.     Findings: No rash.  Neurological:     Mental Status: She is alert and oriented to person, place, and time.     Cranial Nerves: No cranial nerve deficit.  Psychiatric:        Behavior: Behavior normal.     (all labs ordered are listed, but only abnormal results are displayed) Labs Reviewed - No data to display  EKG: None  Radiology: CT Maxillofacial Wo Contrast Result Date: 03/23/2024 EXAM: CT OF THE FACE WITHOUT CONTRAST 03/23/2024 07:33:00 PM TECHNIQUE: CT of the face was performed without the administration of intravenous contrast. Multiplanar reformatted images are provided for review. Automated exposure control, iterative reconstruction, and/or weight based adjustment of the mA/kV was utilized to reduce the radiation dose to as low as reasonably achievable. COMPARISON: None available. CLINICAL HISTORY: Facial trauma, blunt. Pt advises fall approx 9 days ago, seen for same at [facility]. States she did not have head CT but has HA, neck pain since. FINDINGS: FACIAL BONES: No acute facial fracture. No mandibular dislocation. No suspicious bone lesion. ORBITS: Globes are intact. No acute traumatic injury. No inflammatory change. SINUSES AND MASTOIDS: No acute abnormality. SOFT TISSUES: Mild right frontal scalp soft tissue swelling. Enlarged palatine and lingual tonsils. IMPRESSION: 1. No acute facial fracture. 2. Mild right frontal scalp soft  tissue swelling. Electronically signed by: Franky Stanford MD 03/23/2024 07:39 PM EDT RP Workstation: [id number]   CT Head Wo Contrast Result Date: 03/23/2024 CLINICAL DATA:  Fall with head and neck trauma EXAM: CT HEAD WITHOUT CONTRAST CT CERVICAL SPINE WITHOUT CONTRAST TECHNIQUE: Multidetector CT imaging of the head and cervical spine was performed following the standard protocol without intravenous contrast. Multiplanar CT image reconstructions of the cervical spine were also generated. RADIATION DOSE REDUCTION: This exam was performed according to the departmental dose-optimization program which includes automated exposure control, adjustment of the mA and/or kV according to patient size and/or use of iterative reconstruction technique. COMPARISON:  None Available. FINDINGS: CT HEAD FINDINGS Brain: No evidence of acute infarction, hemorrhage,  hydrocephalus, extra-axial collection or mass lesion/mass effect. Vascular: No hyperdense vessel or unexpected calcification. Skull: Normal. Negative for fracture or focal lesion. Sinuses/Orbits: No acute finding. Other: Small forehead scalp hematoma. CT CERVICAL SPINE FINDINGS Alignment: Straightening of the cervical spine. Facet alignment is normal. Skull base and vertebrae: No acute fracture. No primary bone lesion or focal pathologic process. Soft tissues and spinal canal: No prevertebral fluid or swelling. No visible canal hematoma. Disc levels: Mild disc space narrowing and degenerative change C4-C5 and C5-C6. Upper chest: Lung apices are clear Other: Abnormal appearing soft tissue thickening at the vallecula and tonsils IMPRESSION: 1. Negative non contrasted CT appearance of the brain. 2. Straightening of the cervical spine with mild degenerative change. No acute osseous abnormality. 3. Abnormal appearing soft tissue thickening at the vallecula and tonsils. Recommend correlation with direct visualization/ENT consultation. Electronically Signed   By: Luke Bun M.D.    On: 03/23/2024 18:36   CT Cervical Spine Wo Contrast Result Date: 03/23/2024 CLINICAL DATA:  Fall with head and neck trauma EXAM: CT HEAD WITHOUT CONTRAST CT CERVICAL SPINE WITHOUT CONTRAST TECHNIQUE: Multidetector CT imaging of the head and cervical spine was performed following the standard protocol without intravenous contrast. Multiplanar CT image reconstructions of the cervical spine were also generated. RADIATION DOSE REDUCTION: This exam was performed according to the departmental dose-optimization program which includes automated exposure control, adjustment of the mA and/or kV according to patient size and/or use of iterative reconstruction technique. COMPARISON:  None Available. FINDINGS: CT HEAD FINDINGS Brain: No evidence of acute infarction, hemorrhage, hydrocephalus, extra-axial collection or mass lesion/mass effect. Vascular: No hyperdense vessel or unexpected calcification. Skull: Normal. Negative for fracture or focal lesion. Sinuses/Orbits: No acute finding. Other: Small forehead scalp hematoma. CT CERVICAL SPINE FINDINGS Alignment: Straightening of the cervical spine. Facet alignment is normal. Skull base and vertebrae: No acute fracture. No primary bone lesion or focal pathologic process. Soft tissues and spinal canal: No prevertebral fluid or swelling. No visible canal hematoma. Disc levels: Mild disc space narrowing and degenerative change C4-C5 and C5-C6. Upper chest: Lung apices are clear Other: Abnormal appearing soft tissue thickening at the vallecula and tonsils IMPRESSION: 1. Negative non contrasted CT appearance of the brain. 2. Straightening of the cervical spine with mild degenerative change. No acute osseous abnormality. 3. Abnormal appearing soft tissue thickening at the vallecula and tonsils. Recommend correlation with direct visualization/ENT consultation. Electronically Signed   By: Luke Bun M.D.   On: 03/23/2024 18:36     Procedures   Medications Ordered in the ED  - No data to display                                  Medical Decision Making Amount and/or Complexity of Data Reviewed Radiology: ordered and independent interpretation performed. Decision-making details documented in ED Course.   Pt with multiple medical problems and comorbidities and presenting today with a complaint that caries a high risk for morbidity and mortality.  Here today with the above complaints.  Concern for concussion, intracranial hemorrhage, facial fractures or cervical injury.  I have independently visualized and interpreted pt's images today.  CT head without of intracranial bleed and cervical spine without evidence of fracture.  Facial CT without fracture.  Radiology reports mild right frontal scalp soft tissue swelling and negative CT head and neck.  They did say there was abnormal appearing soft tissue thickening at the vallecula and tonsil  and recommend correlation with direct visualization nation and ENT consultation.  Patient is having no issues at this time.  She denies any voice changes, trouble swallowing, globus sensation in her throat.  However these findings were discussed with the patient and she was given outpatient follow-up with ENT for evaluation.  Also findings of the imaging were discussed with the patient.  Suspect concussion.  She was given concussion precautions and follow-up with concussion clinic if symptoms do not improve.       Final diagnoses:  Concussion without loss of consciousness, initial encounter  Contusion of face, initial encounter    ED Discharge Orders     None          Doretha Folks, MD 03/23/24 2028

## 2024-03-23 NOTE — ED Triage Notes (Signed)
 Pt advises fall approx 9 days ago, seen for same at [facility]. States she did not have head CT but has HA, neck pain since.

## 2024-03-23 NOTE — Discharge Instructions (Addendum)
 No sign of any internal bleeding or broken bones today.  You do have a concussion.  Make sure you are giving your brain rest with plenty of sleep and no eyestrain or reading fine print.  If you are still having ongoing symptoms you can follow-up with the concussion clinic.  Also you should follow-up with the ENT clinic so that they can take a look and make sure there is no swelling around your vocal cords.

## 2024-04-05 ENCOUNTER — Ambulatory Visit (INDEPENDENT_AMBULATORY_CARE_PROVIDER_SITE_OTHER): Admitting: Otolaryngology

## 2024-04-05 ENCOUNTER — Encounter (INDEPENDENT_AMBULATORY_CARE_PROVIDER_SITE_OTHER): Payer: Self-pay | Admitting: Otolaryngology

## 2024-04-05 VITALS — BP 107/73 | HR 92

## 2024-04-05 DIAGNOSIS — Q388 Other congenital malformations of pharynx: Secondary | ICD-10-CM

## 2024-04-05 DIAGNOSIS — J351 Hypertrophy of tonsils: Secondary | ICD-10-CM | POA: Diagnosis not present

## 2024-04-05 DIAGNOSIS — D3705 Neoplasm of uncertain behavior of pharynx: Secondary | ICD-10-CM

## 2024-04-05 NOTE — Progress Notes (Signed)
 ENT CONSULT:  Reason for Consult: abnormal soft tissue vallecula c-spine scan    HPI: Discussed the use of AI scribe software for clinical note transcription with the patient, who gave verbal consent to proceed.  History of Present Illness Becky King is a 53 year old female who presents with CT c-spine findings that warrant visualization with scope exam.   She initially sought medical attention after falling down stairs, resulting in shoulder pain. During the evaluation, a CT scan of cervical spine was performed, revealing prominent soft tissue along vallecula. No sensation of throat swelling or neck lump has been experienced in the past. No history of hemoptysis, cancer, stroke, or myocardial infarction. No dyspnea or dysphagia or odynophagia.   She has a history of gallbladder removal, after which her previous symptoms of heartburn resolved.  She quit smoking a couple of months ago.  She experiences dry skin, including psoriasis, affecting her ears and scalp. She uses a cream to manage these symptoms, noting that if she does not apply it daily, the condition worsens.   Records Reviewed:  ED Note 03/23/24 Patient is a 53 year old female with a history of anemia, asthma, kidney stones who is presenting today with complaint of ongoing headache, neck pain, facial pain after falling down multiple bleacher steps 9 days ago. She reports she did hit her face on the way down but did not have any loss of consciousness. She did follow-up in the emergency room where they imaged her right shoulder which was hurting. At that time no fractures were found. Subsequently she followed up with orthopedics and they told her that there was actually a fracture and she was currently in a sling. She and her family have just been very concerned because she has been very irritable, sleepy and fatigued, headaches every day and just not herself. She denies any vision changes. She has no throat pain, difficulty  swallowing or voice changes. No chest pain or shortness of breath and no numbness or tingling going down her arms or legs.     Past Medical History:  Diagnosis Date   Allergy    Anemia    Asthma    HAS MDI   Carpal tunnel syndrome    CARPAL TUNNEL WITH SURGERY    Kidney stones    PONV (postoperative nausea and vomiting)    Seasonal allergies    Vitamin B12 deficiency 07/30/2019    Past Surgical History:  Procedure Laterality Date   APPENDECTOMY     CHOLECYSTECTOMY N/A 01/14/2024   Procedure: LAPAROSCOPIC CHOLECYSTECTOMY WITH INTRAOPERATIVE CHOLANGIOGRAM;  Surgeon: Polly Cordella LABOR, MD;  Location: [facility] OR;  Service: General;  Laterality: N/A;   HERNIA REPAIR     TUBAL LIGATION      Family History  Problem Relation Age of Onset   Fibromyalgia Mother    Heart disease Mother    Colon polyps Mother    Diabetes Maternal Grandmother    Colon cancer Neg Hx    Esophageal cancer Neg Hx    Stomach cancer Neg Hx    Rectal cancer Neg Hx     Social History:  reports that she has been smoking cigarettes. She has never used smokeless tobacco. She reports current alcohol use. She reports that she does not use drugs.  Allergies:  Allergies  Allergen Reactions   Latex Itching, Rash and Other (See Comments)    Cause blisters also    Medications: I have reviewed the patient's current medications.  The PMH, PSH, Medications, Allergies,  and SH were reviewed and updated.  ROS: Constitutional: Negative for fever, weight loss and weight gain. Cardiovascular: Negative for chest pain and dyspnea on exertion. Respiratory: Is not experiencing shortness of breath at rest. Gastrointestinal: Negative for nausea and vomiting. Neurological: Negative for headaches. Psychiatric: The patient is not nervous/anxious  Blood pressure 107/73, pulse 92, SpO2 95%. There is no height or weight on file to calculate BMI.  PHYSICAL EXAM:  Exam: General: Well-developed, well-nourished Communication and  Voice: Clear pitch and clarity Respiratory Respiratory effort: Equal inspiration and expiration without stridor Cardiovascular Peripheral Vascular: Warm extremities with equal color/perfusion Eyes: No nystagmus with equal extraocular motion bilaterally Neuro/Psych/Balance: Patient oriented to person, place, and time; Appropriate mood and affect; Gait is intact with no imbalance; Cranial nerves I-XII are intact Head and Face Inspection: Normocephalic and atraumatic without mass or lesion Palpation: Facial skeleton intact without bony stepoffs Salivary Glands: No mass or tenderness Facial Strength: Facial motility symmetric and full bilaterally ENT Pinna: External ear intact and fully developed External canal: Canal is patent with intact skin Tympanic Membrane: Clear and mobile External Nose: No scar or anatomic deformity Internal Nose: Septum is deviated to the left. No polyp, or purulence. Mucosal edema and erythema present.  Bilateral inferior turbinate hypertrophy.  Lips, Teeth, and gums: Mucosa and teeth intact and viable TMJ: No pain to palpation with full mobility Oral cavity/oropharynx: No erythema or exudate, no lesions present 4+ tonsils Large tonsils crowd upper airway left tonsil more prominent and lingual tonsillar asymmetry present on the left Nasopharynx: No mass or lesion with intact mucosa Hypopharynx: Intact mucosa without pooling of secretions Larynx Glottic: Full true vocal cord mobility without lesion or mass Supraglottic: Normal appearing epiglottis and AE folds Interarytenoid Space: Moderate pachydermia&edema Subglottic Space: Patent without lesion or edema Neck Neck and Trachea: Midline trachea without mass or lesion Thyroid: No mass or nodularity Lymphatics: No lymphadenopathy  Procedure: Preoperative diagnosis: asymmetric appearance of base of the tongue vallecula on CT c-spine  Postoperative diagnosis:   Same left tonsillar asymmetry   Procedure:  Flexible fiberoptic laryngoscopy  Surgeon: Elena Larry, MD  Anesthesia: Topical lidocaine  and Afrin Complications: None Condition is stable throughout exam  Indications and consent:  The patient presents to the clinic with above symptoms. Indirect laryngoscopy view was incomplete. Thus it was recommended that they undergo a flexible fiberoptic laryngoscopy. All of the risks, benefits, and potential complications were reviewed with the patient preoperatively and verbal informed consent was obtained.  Procedure: The patient was seated upright in the clinic. Topical lidocaine  and Afrin were applied to the nasal cavity. After adequate anesthesia had occurred, I then proceeded to pass the flexible telescope into the nasal cavity. The nasal cavity was patent without rhinorrhea or polyp. The nasopharynx was also patent without mass or lesion. The base of tongue was visualized and was normal. There were no signs of pooling of secretions in the piriform sinuses. The true vocal folds were mobile bilaterally. There were no signs of glottic or supraglottic mucosal lesion or mass. There was moderate interarytenoid pachydermia and post cricoid edema. The telescope was then slowly withdrawn and the patient tolerated the procedure throughout.    Studies Reviewed: CT max face 03/23/24 CLINICAL HISTORY: Facial trauma, blunt. Pt advises fall approx 9 days ago, seen for same at [facility]. States she did not have head CT but has HA, neck pain since.   FINDINGS:   FACIAL BONES: No acute facial fracture. No mandibular dislocation. No suspicious bone lesion.  ORBITS: Globes are intact. No acute traumatic injury. No inflammatory change.   SINUSES AND MASTOIDS: No acute abnormality.   SOFT TISSUES: Mild right frontal scalp soft tissue swelling. Enlarged palatine and lingual tonsils.   IMPRESSION: 1. No acute facial fracture. 2. Mild right frontal scalp soft tissue swelling.  CT c-spine CT HEAD FINDINGS    Brain: No evidence of acute infarction, hemorrhage, hydrocephalus, extra-axial collection or mass lesion/mass effect.   Vascular: No hyperdense vessel or unexpected calcification.   Skull: Normal. Negative for fracture or focal lesion.   Sinuses/Orbits: No acute finding.   Other: Small forehead scalp hematoma.   CT CERVICAL SPINE FINDINGS   Alignment: Straightening of the cervical spine. Facet alignment is normal.   Skull base and vertebrae: No acute fracture. No primary bone lesion or focal pathologic process.   Soft tissues and spinal canal: No prevertebral fluid or swelling. No visible canal hematoma.   Disc levels: Mild disc space narrowing and degenerative change C4-C5 and C5-C6.   Upper chest: Lung apices are clear   Other: Abnormal appearing soft tissue thickening at the vallecula and tonsils   IMPRESSION: 1. Negative non contrasted CT appearance of the brain. 2. Straightening of the cervical spine with mild degenerative change. No acute osseous abnormality. 3. Abnormal appearing soft tissue thickening at the vallecula and tonsils. Recommend correlation with direct visualization/ENT consultation.  Assessment/Plan: No diagnosis found.  Assessment and Plan Assessment & Plan Enlarged tonsils with airway crowding and asymmetric left oropharyngeal tissue with left palatine and lingual tonsillar enlargement much more pronounced relative to the right.  Neoplasm cannot be ruled out. We discussed tonsillectomy and direct laryngoscopy and biopsy of the left base of the tongue, discussed risks and benefits and she would like to proceed.  - Schedule tonsillectomy to alleviate obstruction and examine asymmetric tissue. - Perform direct laryngoscopy bronchoscopy and biopsy of asymmetric tissue along the left base of the tongue vallecula - Coordinate with surgery scheduler for procedure date.   RTC for surgery   Thank you for allowing me to participate in the care of  this patient. Please do not hesitate to contact me with any questions or concerns.   Elena Larry, MD Otolaryngology [facility] Phone: [phone] Fax: [phone]    04/05/2024, 9:32 AM

## 2024-04-19 ENCOUNTER — Other Ambulatory Visit (HOSPITAL_BASED_OUTPATIENT_CLINIC_OR_DEPARTMENT_OTHER): Payer: Self-pay | Admitting: Nurse Practitioner

## 2024-04-19 DIAGNOSIS — Z1231 Encounter for screening mammogram for malignant neoplasm of breast: Secondary | ICD-10-CM

## 2024-05-01 ENCOUNTER — Ambulatory Visit: Admitting: Nurse Practitioner

## 2024-05-15 ENCOUNTER — Telehealth (INDEPENDENT_AMBULATORY_CARE_PROVIDER_SITE_OTHER): Payer: Self-pay | Admitting: Otolaryngology

## 2024-05-15 NOTE — Telephone Encounter (Signed)
 LVM for patient to call back to reschedule 10/17 appointment.  Dr. Okey not in office.

## 2024-05-17 ENCOUNTER — Encounter (HOSPITAL_COMMUNITY): Payer: Self-pay

## 2024-05-17 ENCOUNTER — Other Ambulatory Visit: Payer: Self-pay

## 2024-05-17 NOTE — Progress Notes (Signed)
 SDW call  Patient was given pre-op instructions over the phone. Patient verbalized understanding of instructions provided.   PCP -  Channing Sero, FNP Cardiologist -  Pulmonary:    PPM/ICD - Denies Device Orders - na Rep Notified - na   Chest x-ray - 03/14/2024 EKG -  01/18/2024 Stress Test - ECHO -  Cardiac Cath -   Sleep Study/sleep apnea/CPAP: denies  Non-diabetic   Blood Thinner Instructions: denies Aspirin Instructions:denies   ERAS Protcol - NPO   Anesthesia review: No   Patient denies shortness of breath, fever, cough and chest pain over the phone call  Your procedure is scheduled on Friday May 18, 2024  Report to [facility] Main Entrance A at  0930  A.M., then check in with the Admitting office.  Call this number if you have problems the morning of surgery:  [phone]   If you have any questions prior to your surgery date call [phone]: Open Monday-Friday 8am-4pm If you experience any cold or flu symptoms such as cough, fever, chills, shortness of breath, etc. between now and your scheduled surgery, please notify us  at the above number    Remember:  Do not eat or drink after midnight the night before your surgery  Take these medicines the morning of surgery with A SIP OF WATER:  Allegra  As needed: Albuterol , flonase , eye drops  As of today, STOP taking any Aspirin (unless otherwise instructed by your surgeon) Aleve, Naproxen, Ibuprofen , Motrin , Advil , Goody's, BC's, all herbal medications, fish oil, and all vitamins.

## 2024-05-18 ENCOUNTER — Ambulatory Visit (HOSPITAL_COMMUNITY): Admitting: Anesthesiology

## 2024-05-18 ENCOUNTER — Encounter (HOSPITAL_COMMUNITY)
Admission: RE | Disposition: A | Discharge status: Home / Self Care | Payer: Self-pay | Source: Home / Self Care | Attending: Otolaryngology

## 2024-05-18 ENCOUNTER — Other Ambulatory Visit: Payer: Self-pay

## 2024-05-18 ENCOUNTER — Encounter (HOSPITAL_COMMUNITY): Payer: Self-pay

## 2024-05-18 ENCOUNTER — Ambulatory Visit (HOSPITAL_COMMUNITY)
Admission: RE | Admit: 2024-05-18 | Discharge: 2024-05-18 | Disposition: A | Discharge status: Home / Self Care | Attending: Otolaryngology | Admitting: Otolaryngology

## 2024-05-18 DIAGNOSIS — D3705 Neoplasm of uncertain behavior of pharynx: Secondary | ICD-10-CM | POA: Diagnosis not present

## 2024-05-18 DIAGNOSIS — J312 Chronic pharyngitis: Secondary | ICD-10-CM | POA: Insufficient documentation

## 2024-05-18 DIAGNOSIS — Z7951 Long term (current) use of inhaled steroids: Secondary | ICD-10-CM | POA: Insufficient documentation

## 2024-05-18 DIAGNOSIS — Q383 Other congenital malformations of tongue: Secondary | ICD-10-CM | POA: Diagnosis not present

## 2024-05-18 DIAGNOSIS — J45909 Unspecified asthma, uncomplicated: Secondary | ICD-10-CM | POA: Insufficient documentation

## 2024-05-18 DIAGNOSIS — Z87891 Personal history of nicotine dependence: Secondary | ICD-10-CM | POA: Insufficient documentation

## 2024-05-18 DIAGNOSIS — J351 Hypertrophy of tonsils: Secondary | ICD-10-CM | POA: Diagnosis not present

## 2024-05-18 DIAGNOSIS — Z79899 Other long term (current) drug therapy: Secondary | ICD-10-CM | POA: Insufficient documentation

## 2024-05-18 DIAGNOSIS — J0391 Acute recurrent tonsillitis, unspecified: Secondary | ICD-10-CM

## 2024-05-18 HISTORY — PX: RIGID BRONCHOSCOPY: SHX5069

## 2024-05-18 HISTORY — PX: TONSILLECTOMY: SHX5217

## 2024-05-18 LAB — COMPREHENSIVE METABOLIC PANEL WITH GFR
ALT: 18 U/L (ref 0–44)
AST: 20 U/L (ref 15–41)
Albumin: 3.5 g/dL (ref 3.5–5.0)
Alkaline Phosphatase: 44 U/L (ref 38–126)
Anion gap: 9 (ref 5–15)
BUN: 12 mg/dL (ref 6–20)
CO2: 23 mmol/L (ref 22–32)
Calcium: 8.7 mg/dL — ABNORMAL LOW (ref 8.9–10.3)
Chloride: 108 mmol/L (ref 98–111)
Creatinine, Ser: 0.43 mg/dL — ABNORMAL LOW (ref 0.44–1.00)
GFR, Estimated: >60 mL/min (ref >60)
Glucose, Bld: 97 mg/dL (ref 70–99)
Potassium: 3.8 mmol/L (ref 3.5–5.1)
Sodium: 140 mmol/L (ref 135–145)
Total Bilirubin: 0.7 mg/dL (ref 0.0–1.2)
Total Protein: 6.4 g/dL — ABNORMAL LOW (ref 6.5–8.1)

## 2024-05-18 LAB — CBC
HCT: 41 % (ref 36.0–46.0)
Hemoglobin: 13.5 g/dL (ref 12.0–15.0)
MCH: 29.3 pg (ref 26.0–34.0)
MCHC: 32.9 g/dL (ref 30.0–36.0)
MCV: 89.1 fL (ref 80.0–100.0)
Platelets: 301 K/uL (ref 150–400)
RBC: 4.6 MIL/uL (ref 3.87–5.11)
RDW: 12.1 % (ref 11.5–15.5)
WBC: 7 K/uL (ref 4.0–10.5)
nRBC: 0 % (ref 0.0–0.2)

## 2024-05-18 SURGERY — BRONCHOSCOPY, RIGID
Anesthesia: General | Site: Mouth

## 2024-05-18 MED ORDER — OXYCODONE HCL 5 MG PO TABS
5.0000 mg | ORAL_TABLET | Freq: Once | ORAL | Status: AC | PRN
  Administered 2024-05-18: 5 mg via ORAL

## 2024-05-18 MED ORDER — MIDAZOLAM HCL 2 MG/2ML IJ SOLN
INTRAMUSCULAR | Status: AC
  Filled 2024-05-18: qty 2

## 2024-05-18 MED ORDER — SUGAMMADEX SODIUM 200 MG/2ML IV SOLN
INTRAVENOUS | Status: DC | PRN
  Administered 2024-05-18 (×2): 200 mg via INTRAVENOUS

## 2024-05-18 MED ORDER — 0.9 % SODIUM CHLORIDE (POUR BTL) OPTIME
TOPICAL | Status: DC | PRN
  Administered 2024-05-18: 1000 mL

## 2024-05-18 MED ORDER — LACTATED RINGERS IV SOLN: INTRAVENOUS | Status: DC

## 2024-05-18 MED ORDER — OXYMETAZOLINE HCL 0.05 % NA SOLN
NASAL | Status: DC | PRN
  Administered 2024-05-18: 1 via TOPICAL

## 2024-05-18 MED ORDER — PROPOFOL 500 MG/50ML IV EMUL
INTRAVENOUS | Status: DC | PRN
  Administered 2024-05-18: 150 ug/kg/min via INTRAVENOUS

## 2024-05-18 MED ORDER — OXYCODONE HCL 5 MG PO TABS
ORAL_TABLET | ORAL | Status: AC
  Filled 2024-05-18: qty 1

## 2024-05-18 MED ORDER — FENTANYL CITRATE (PF) 250 MCG/5ML IJ SOLN
INTRAMUSCULAR | Status: DC | PRN
  Administered 2024-05-18 (×2): 50 ug via INTRAVENOUS

## 2024-05-18 MED ORDER — AMISULPRIDE (ANTIEMETIC) 5 MG/2ML IV SOLN: 10.0000 mg | Freq: Once | INTRAVENOUS | Status: DC | PRN

## 2024-05-18 MED ORDER — IBUPROFEN 600 MG PO TABS: 600.0000 mg | ORAL_TABLET | Freq: Four times a day (QID) | ORAL | 0 refills | Status: AC

## 2024-05-18 MED ORDER — OXYCODONE HCL 5 MG/5ML PO SOLN: 5.0000 mg | Freq: Once | ORAL | Status: AC | PRN

## 2024-05-18 MED ORDER — SCOPOLAMINE 1 MG/3DAYS TD PT72
1.0000 | MEDICATED_PATCH | TRANSDERMAL | Status: DC
  Administered 2024-05-18: 1 mg via TRANSDERMAL
  Filled 2024-05-18: qty 1

## 2024-05-18 MED ORDER — FENTANYL CITRATE (PF) 100 MCG/2ML IJ SOLN
INTRAMUSCULAR | Status: AC
  Filled 2024-05-18: qty 2

## 2024-05-18 MED ORDER — DEXAMETHASONE SODIUM PHOSPHATE 10 MG/ML IJ SOLN
INTRAMUSCULAR | Status: DC | PRN
  Administered 2024-05-18: 10 mg via INTRAVENOUS

## 2024-05-18 MED ORDER — OXYMETAZOLINE HCL 0.05 % NA SOLN
NASAL | Status: AC
  Filled 2024-05-18: qty 30

## 2024-05-18 MED ORDER — CHLORHEXIDINE GLUCONATE 0.12 % MT SOLN
15.0000 mL | Freq: Once | OROMUCOSAL | Status: AC
  Administered 2024-05-18: 15 mL via OROMUCOSAL
  Filled 2024-05-18: qty 15

## 2024-05-18 MED ORDER — ACETAMINOPHEN 500 MG PO TABS: 500.0000 mg | ORAL_TABLET | Freq: Four times a day (QID) | ORAL | 0 refills | Status: DC

## 2024-05-18 MED ORDER — ONDANSETRON HCL 4 MG/2ML IJ SOLN
INTRAMUSCULAR | Status: DC | PRN
  Administered 2024-05-18: 4 mg via INTRAVENOUS

## 2024-05-18 MED ORDER — ROCURONIUM BROMIDE 10 MG/ML (PF) SYRINGE
PREFILLED_SYRINGE | INTRAVENOUS | Status: DC | PRN
  Administered 2024-05-18: 50 mg via INTRAVENOUS

## 2024-05-18 MED ORDER — PROPOFOL 10 MG/ML IV BOLUS
INTRAVENOUS | Status: DC | PRN
  Administered 2024-05-18: 150 mg via INTRAVENOUS
  Administered 2024-05-18: 40 mg via INTRAVENOUS

## 2024-05-18 MED ORDER — MIDAZOLAM HCL 2 MG/2ML IJ SOLN
INTRAMUSCULAR | Status: DC | PRN
  Administered 2024-05-18: 2 mg via INTRAVENOUS

## 2024-05-18 MED ORDER — ORAL CARE MOUTH RINSE: 15.0000 mL | Freq: Once | OROMUCOSAL | Status: AC

## 2024-05-18 MED ORDER — OXYCODONE HCL 5 MG PO TABS: 5.0000 mg | ORAL_TABLET | Freq: Four times a day (QID) | ORAL | 0 refills | Status: DC | PRN

## 2024-05-18 MED ORDER — FENTANYL CITRATE (PF) 100 MCG/2ML IJ SOLN: 25.0000 ug | INTRAMUSCULAR | Status: DC | PRN

## 2024-05-18 MED ORDER — LIDOCAINE 2% (20 MG/ML) 5 ML SYRINGE
INTRAMUSCULAR | Status: DC | PRN
  Administered 2024-05-18: 100 mg via INTRAVENOUS

## 2024-05-18 SURGICAL SUPPLY — 36 items
BAG COUNTER SPONGE SURGICOUNT (BAG) ×2 IMPLANT
CANISTER SUCTION 3000ML PPV (SUCTIONS) ×2 IMPLANT
CATH ROBINSON RED A/P 12FR (CATHETERS) ×2 IMPLANT
CLEANER TIP ELECTROSURG 2X2 (MISCELLANEOUS) ×2 IMPLANT
CNTNR URN SCR LID CUP LEK RST (MISCELLANEOUS) IMPLANT
COAGULATOR SUCT 8FR VV (MISCELLANEOUS) IMPLANT
CORD BIPOLAR FORCEPS 12FT (ELECTRODE) IMPLANT
COVER BACK TABLE 60X90IN (DRAPES) ×2 IMPLANT
COVER MAYO STAND STRL (DRAPES) ×2 IMPLANT
DRAPE HALF SHEET 40X57 (DRAPES) ×2 IMPLANT
ELECT COATED BLADE 2.86 ST (ELECTRODE) ×2 IMPLANT
ELECTRODE REM PT RTRN 9FT ADLT (ELECTROSURGICAL) IMPLANT
FORCEPS BIPOLAR SPETZLER 8 1.0 (NEUROSURGERY SUPPLIES) IMPLANT
GAUZE 4X4 16PLY ~~LOC~~+RFID DBL (SPONGE) ×2 IMPLANT
GLOVE BIO SURGEON STRL SZ 6 (GLOVE) ×2 IMPLANT
GOWN STRL REUS W/ TWL LRG LVL3 (GOWN DISPOSABLE) ×4 IMPLANT
GUARD TEETH (MISCELLANEOUS) ×2 IMPLANT
HEMOSTAT ARISTA ABSORB 3G PWDR (HEMOSTASIS) IMPLANT
KIT BASIN OR (CUSTOM PROCEDURE TRAY) ×2 IMPLANT
KIT TURNOVER KIT B (KITS) ×2 IMPLANT
PACK SRG BSC III STRL LF ECLPS (CUSTOM PROCEDURE TRAY) ×2 IMPLANT
PAD ARMBOARD POSITIONER FOAM (MISCELLANEOUS) ×4 IMPLANT
PATTIES SURGICAL .5 X.5 (GAUZE/BANDAGES/DRESSINGS) ×2 IMPLANT
PATTIES SURGICAL .5 X3 (DISPOSABLE) ×2 IMPLANT
PENCIL BUTTON HOLSTER BLD 10FT (ELECTRODE) ×2 IMPLANT
POSITIONER HEAD DONUT 9IN (MISCELLANEOUS) ×2 IMPLANT
SET COLLECT BLD 25X3/4 12 (NEEDLE) ×2 IMPLANT
SOLN 0.9% NACL 1000 ML (IV SOLUTION) ×2 IMPLANT
SOLN 0.9% NACL POUR BTL 1000ML (IV SOLUTION) ×2 IMPLANT
SPONGE TONSIL 1.25 RF SGL STRG (GAUZE/BANDAGES/DRESSINGS) IMPLANT
SYR BULB EAR ULCER 3OZ GRN STR (SYRINGE) ×2 IMPLANT
TOWEL GREEN STERILE FF (TOWEL DISPOSABLE) ×4 IMPLANT
TUBE CONNECTING 12X1/4 (SUCTIONS) ×2 IMPLANT
TUBE SALEM SUMP 16F (TUBING) ×2 IMPLANT
TUBING EXTENTION W/L.L. (IV SETS) ×2 IMPLANT
YANKAUER SUCT BULB TIP NO VENT (SUCTIONS) ×2 IMPLANT

## 2024-05-18 NOTE — Op Note (Signed)
 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 Risks and Benefits of surgery were discussed with the patient including bleeding, anesthesia risk, trouble breathing, dehydration and sore throat.   Operative Procedure: The patient was correctly identified and brought to the operating room and placed in supine position. An endotracheal tube was placed without difficulty and the head of the bed was rotated 90 degrees with respect to anestheisa. We first used Lindholm laryngoscope and examined the area along the base of the tongue and vallecula. There was lingual tonsillar asymmetry and extension along epiglottis which we biopsied on the left side and along the epiglottis. Final biopsy specimens were sent for permanent histopathology.  There was evidence of bilateral tonsillar hypertrophy. A Crowe- Davis mouth gag was used to expose the oropharynx. A red rubber catheter was placed in the right naris to retract the soft palate. A curved Allis was used to grasp the right and left tonsils in an consecutive fashion. A Bovie was used to gently dissect the tonsils away from the surrounding muscular pillars. Hemostasis was obtained  along the way. The mouth gag was let down for 3 minutes to evaluate for any bleeding. Both tonsils were submitted separately for permanent histopathology. The oropharynx was copiously irrigated with saline and the stomach contents were suctioned. The patient was returned to anesthesia and awoken in stable condition.   The patient will be discharged on pain medications. A follow up will be scheduled in 3 weeks

## 2024-05-18 NOTE — Discharge Instructions (Signed)
 Post-Operative Instructions (Tonsillectomy)  What to Expect: - It is common to have a sore throat for several days after surgery from the breathing tube. - You can eat and drink anything you normally do - there are no restrictions due to surgery alone.  If you have been recommended any other type of diet, such as an acid reflux diet, you should continue that.  You may want to eat light meals the day of anesthesia to make sure you don't get nauseated. - Take scheduled Tylenol and Motrin every 6 hrs staggered 3 hrs apart from each other. If your pain is still severe while doing that, and it will be the first 2-3 days, take Oxycodone - It is very important that you stay well-hydrated and drink plenty of fluids during the recovery period.  Getting dehydrated tends to worsen post-operative pain. - After tonsillectomy, you should avoid any exercise or activity that gets your heart rate up for at least 10 days after surgery. - After tonsillectomy, you need to watch out for bleeding from the back of your mouth where the tonsils were; this can occur as a brief episode of bleeding that stops on its own, or as more serious or persistent bleeding.  If it is persistent, you should have someone drive you to the [facility] ER immediately and call the numbers above on the way to notify us.   - If the bleeding is brief and stops on its own, you can gargle cold water and call my team at the numbers above for further instructions. - You should avoid lifting anything heavier than a gallon of milk for 2 weeks. - Call the office if you experience any of the following: Fever higher than 101 F Difficulty breathing or swallowing Bleeding that occurs suddenly or briskly and won't stop, or any other concerning bleeding Sharp increase in pain - note that with tonsillectomy, a sharp increase in pain is normal around day 5-7 after surgery. It is also normal for pain to be much worse in the mornings when you wake up. Nausea and  Vomiting  Take Zofran if you experience nausea. It is important to take pain medications with some food in your stomach to avoid nausea and vomiting  You should contact Dr Leighton Roach office at [phone] if you need to be seen or if you experience any of the above symptoms.

## 2024-05-18 NOTE — Transfer of Care (Signed)
 Immediate Anesthesia Transfer of Care Note  Patient: Becky King  Procedure(s) Performed: BRONCHOSCOPY, RIGID (Mouth) TONSILLECTOMY (Bilateral: Mouth)  Patient Location: PACU  Anesthesia Type:General  Level of Consciousness: awake, alert , and oriented  Airway & Oxygen Therapy: Patient Spontanous Breathing and Patient connected to face mask oxygen  Post-op Assessment: Report given to RN, Post -op Vital signs reviewed and stable, and Patient moving all extremities X 4  Post vital signs: Reviewed and stable  Last Vitals:  Vitals Value Taken Time  BP 130/58 05/18/24 13:46  Temp 36.6 C 05/18/24 13:45  Pulse 54 05/18/24 13:49  Resp 17 05/18/24 13:49  SpO2 94 % 05/18/24 13:49  Vitals shown include unfiled device data.  Last Pain:  Vitals:   05/18/24 1345  TempSrc:   PainSc: 0-No pain         Complications: No notable events documented.

## 2024-05-18 NOTE — Anesthesia Preprocedure Evaluation (Addendum)
 Anesthesia Evaluation  Patient identified by MRN, date of birth, ID band Patient awake    Reviewed: Allergy & Precautions, NPO status , Patient's Chart, lab work & pertinent test results  History of Anesthesia Complications (+) PONV and history of anesthetic complications  Airway Mallampati: II  TM Distance: >3 FB Neck ROM: Full    Dental  (+) Missing, Chipped, Poor Dentition, Dental Advisory Given   Pulmonary asthma , Patient abstained from smoking., former smoker   Pulmonary exam normal breath sounds clear to auscultation       Cardiovascular negative cardio ROS Normal cardiovascular exam Rhythm:Regular Rate:Normal     Neuro/Psych negative neurological ROS  negative psych ROS   GI/Hepatic negative GI ROS, Neg liver ROS,,,  Endo/Other  negative endocrine ROS    Renal/GU negative Renal ROS  negative genitourinary   Musculoskeletal negative musculoskeletal ROS (+)    Abdominal   Peds  Hematology negative hematology ROS (+)   Anesthesia Other Findings   Reproductive/Obstetrics                              Anesthesia Physical Anesthesia Plan  ASA: 2  Anesthesia Plan: General   Post-op Pain Management: Ofirmev  IV (intra-op)*   Induction: Intravenous  PONV Risk Score and Plan: 4 or greater and Midazolam , Dexamethasone , Ondansetron , TIVA and Scopolamine  patch - Pre-op  Airway Management Planned: Oral ETT and Video Laryngoscope Planned  Additional Equipment:   Intra-op Plan:   Post-operative Plan: Extubation in OR  Informed Consent: I have reviewed the patients History and Physical, chart, labs and discussed the procedure including the risks, benefits and alternatives for the proposed anesthesia with the patient or authorized representative who has indicated his/her understanding and acceptance.     Dental advisory given  Plan Discussed with: CRNA  Anesthesia Plan Comments:           Anesthesia Quick Evaluation

## 2024-05-18 NOTE — Anesthesia Procedure Notes (Signed)
 Procedure Name: Intubation Date/Time: 05/18/2024 1:17 PM  Performed by: Mollie Olivia SAUNDERS, CRNAPre-anesthesia Checklist: Patient identified, Emergency Drugs available, Suction available and Patient being monitored Patient Re-evaluated:Patient Re-evaluated prior to induction Oxygen Delivery Method: Circle system utilized Preoxygenation: Pre-oxygenation with 100% oxygen Induction Type: IV induction Ventilation: Mask ventilation without difficulty Laryngoscope Size: 3 and Glidescope Grade View: Grade I Tube type: Oral Rae Tube size: 7.0 mm Number of attempts: 1 Airway Equipment and Method: Stylet, Oral airway and Video-laryngoscopy Placement Confirmation: ETT inserted through vocal cords under direct vision, positive ETCO2 and breath sounds checked- equal and bilateral Secured at: 22 cm Tube secured with: Tape Dental Injury: Teeth and Oropharynx as per pre-operative assessment

## 2024-05-18 NOTE — H&P (Signed)
 Becky King is an 53 y.o. female.    Chief Complaint:  chronic tonsillitis and asymmetry  HPI: Patient presents today for planned elective procedure.  He/she denies any interval change in history since office visit on 04/05/24  Past Medical History:  Diagnosis Date   Allergy    Anemia    Asthma    HAS MDI   Carpal tunnel syndrome    CARPAL TUNNEL WITH SURGERY    Gallstone pancreatitis 2025   Kidney stones    PONV (postoperative nausea and vomiting)    Seasonal allergies    Vitamin B12 deficiency 07/30/2019    Past Surgical History:  Procedure Laterality Date   APPENDECTOMY     CHOLECYSTECTOMY N/A 01/14/2024   Procedure: LAPAROSCOPIC CHOLECYSTECTOMY WITH INTRAOPERATIVE CHOLANGIOGRAM;  Surgeon: Polly Cordella LABOR, MD;  Location: [facility] OR;  Service: General;  Laterality: N/A;   HERNIA REPAIR     TUBAL LIGATION      Family History  Problem Relation Age of Onset   Fibromyalgia Mother    Heart disease Mother    Colon polyps Mother    Diabetes Maternal Grandmother    Colon cancer Neg Hx    Esophageal cancer Neg Hx    Stomach cancer Neg Hx    Rectal cancer Neg Hx     Social History:  reports that she quit smoking about 8 weeks ago. Her smoking use included cigarettes. She has never used smokeless tobacco. She reports current alcohol use. She reports that she does not use drugs.  Allergies:  Allergies  Allergen Reactions   Latex Itching, Rash and Other (See Comments)    Cause blisters also    Medications Prior to Admission  Medication Sig Dispense Refill   albuterol  (VENTOLIN  HFA) 108 (90 Base) MCG/ACT inhaler Inhale 1-2 puffs into the lungs every 6 (six) hours as needed for wheezing or shortness of breath.     clobetasol cream (TEMOVATE) 0.05 % Apply 1 Application topically 2 (two) times daily as needed (psoriasis).     Cyanocobalamin  (VITAMIN B 12 PO) Take 1 tablet by mouth daily.     Emollient (EUCERIN) lotion Apply 1 Application topically as needed for dry skin.      fexofenadine (ALLEGRA) 180 MG tablet Take 180 mg by mouth daily.     fluticasone  (FLONASE ) 50 MCG/ACT nasal spray Place 1 spray into both nostrils daily as needed for allergies or rhinitis.     oxycodone  (OXY-IR) 5 MG capsule Take 1 capsule (5 mg total) by mouth every 4 (four) hours as needed. 30 capsule 0   polyvinyl alcohol (LIQUIFILM TEARS) 1.4 % ophthalmic solution Place 1 drop into both eyes as needed for dry eyes. [employer] Lomb For dry eyes     estradiol  (ESTRACE  VAGINAL) 0.1 MG/GM vaginal cream Place 1 g vaginally 2 (two) times a week. (Patient taking differently: Place 1 g vaginally See admin instructions. Twice a week if needed for dryness) 42.5 g 1    Results for orders placed or performed during the hospital encounter of 05/18/24 (from the past 48 hours)  CBC per protocol     Status: None   Collection Time: 05/18/24  9:58 AM  Result Value Ref Range   WBC 7.0 4.0 - 10.5 K/uL   RBC 4.60 3.87 - 5.11 MIL/uL   Hemoglobin 13.5 12.0 - 15.0 g/dL   HCT 58.9 63.9 - 53.9 %   MCV 89.1 80.0 - 100.0 fL   MCH 29.3 26.0 - 34.0 pg  MCHC 32.9 30.0 - 36.0 g/dL   RDW 87.8 88.4 - 84.4 %   Platelets 301 150 - 400 K/uL   nRBC 0.0 0.0 - 0.2 %    Comment: Performed at [facility], [address], [place], [place] [address]  Comprehensive metabolic panel     Status: Abnormal   Collection Time: 05/18/24  9:59 AM  Result Value Ref Range   Sodium 140 135 - 145 mmol/L   Potassium 3.8 3.5 - 5.1 mmol/L   Chloride 108 98 - 111 mmol/L   CO2 23 22 - 32 mmol/L   Glucose, Bld 97 70 - 99 mg/dL    Comment: Glucose reference range applies only to samples taken after fasting for at least 8 hours.   BUN 12 6 - 20 mg/dL   Creatinine, Ser 9.56 (L) 0.44 - 1.00 mg/dL   Calcium 8.7 (L) 8.9 - 10.3 mg/dL   Total Protein 6.4 (L) 6.5 - 8.1 g/dL   Albumin 3.5 3.5 - 5.0 g/dL   AST 20 15 - 41 U/L   ALT 18 0 - 44 U/L   Alkaline Phosphatase 44 38 - 126 U/L   Total Bilirubin 0.7 0.0 - 1.2 mg/dL   GFR,  Estimated >39 >39 mL/min    Comment: (NOTE) Calculated using the CKD-EPI Creatinine Equation (2021)    Anion gap 9 5 - 15    Comment: Performed at [facility], [address], [place], [place] [address]   No results found.  ROS:  ROS: Constitutional: Negative for fever, weight loss and weight gain. Cardiovascular: Negative for chest pain and dyspnea on exertion. Respiratory: Is not experiencing shortness of breath at rest. Gastrointestinal: Negative for nausea and vomiting. Neurological: Negative for headaches. Psychiatric: The patient is not nervous/anxious   Blood pressure 138/70, pulse 69, temperature 98.1 F (36.7 C), temperature source Oral, resp. rate 19, height 5' 3 (1.6 m), weight 89.8 kg, last menstrual period 01/12/2018, SpO2 95%.  PHYSICAL EXAM: Exam: General: Well-developed, well-nourished Respiratory Respiratory effort: Equal inspiration and expiration without stridor Cardiovascular Peripheral Vascular: Warm extremities with equal color/perfusion Eyes: No nystagmus with equal extraocular motion bilaterally Neuro/Psych/Balance: Patient oriented to person, place, and time; Appropriate mood and affect; Gait is intact with no imbalance; Cranial nerves I-XII are intact Head and Face Inspection: Normocephalic and atraumatic without mass or lesion Palpation: Facial skeleton intact without bony stepoffs Salivary Glands: No mass or tenderness  Facial Strength: Facial motility symmetric and full bilaterally ENT Pinna: External ear intact and fully developed External canal: Canal is patent with intact skin Tympanic Membrane: Clear and mobile External Nose: No scar or anatomic deformity Internal Nose: Septum is relatively straight on anterior rhinoscopy. Bilateral inferior turbinate hypertrophy.  Lips, Teeth, and gums: Mucosa and teeth intact and viable Oral cavity/oropharynx: No erythema or exudate, no lesions present Neck Neck and Trachea: Midline trachea  without mass or lesion Thyroid: No mass or nodularity Lymphatics: No lymphadenopathy    Assessment/Plan Chronic tonsillitis Tonsillar and base of the tongue asymmetry  - proceed with surgery    Becky King 05/18/2024, 11:55 AM

## 2024-05-20 ENCOUNTER — Telehealth (INDEPENDENT_AMBULATORY_CARE_PROVIDER_SITE_OTHER): Payer: Self-pay | Admitting: Otolaryngology

## 2024-05-20 MED ORDER — PREDNISONE 10 MG PO TABS: 20.0000 mg | ORAL_TABLET | Freq: Every day | ORAL | 0 refills | Status: DC

## 2024-05-20 NOTE — Telephone Encounter (Signed)
 ENT Note: Patient called back reports that it is still difficult to swallow - drinking little water but has to take tiny sips. Pain is doing ok, no problems with breathing. No bleeding. Voice strong. Wonder if some post-op edema contributing to this so will give her short course of prednisone 20mg  x3 d daily. Risks discussed. ED precautions discussed. If not improving, call back tomorrow

## 2024-05-21 ENCOUNTER — Observation Stay (HOSPITAL_BASED_OUTPATIENT_CLINIC_OR_DEPARTMENT_OTHER)
Admission: EM | Admit: 2024-05-21 | Discharge: 2024-05-23 | Disposition: A | Discharge status: Home / Self Care | Attending: Internal Medicine | Admitting: Internal Medicine

## 2024-05-21 ENCOUNTER — Other Ambulatory Visit: Payer: Self-pay

## 2024-05-21 ENCOUNTER — Encounter (HOSPITAL_COMMUNITY): Payer: Self-pay | Admitting: Otolaryngology

## 2024-05-21 DIAGNOSIS — Z91040 Latex allergy status: Secondary | ICD-10-CM | POA: Insufficient documentation

## 2024-05-21 DIAGNOSIS — Z9089 Acquired absence of other organs: Secondary | ICD-10-CM | POA: Insufficient documentation

## 2024-05-21 DIAGNOSIS — J0431 Supraglottitis, unspecified, with obstruction: Secondary | ICD-10-CM

## 2024-05-21 DIAGNOSIS — G8918 Other acute postprocedural pain: Secondary | ICD-10-CM | POA: Diagnosis not present

## 2024-05-21 DIAGNOSIS — J0510 Acute epiglottitis without obstruction: Principal | ICD-10-CM | POA: Insufficient documentation

## 2024-05-21 DIAGNOSIS — F1090 Alcohol use, unspecified, uncomplicated: Secondary | ICD-10-CM | POA: Diagnosis not present

## 2024-05-21 DIAGNOSIS — R1310 Dysphagia, unspecified: Secondary | ICD-10-CM | POA: Diagnosis not present

## 2024-05-21 DIAGNOSIS — Z79899 Other long term (current) drug therapy: Secondary | ICD-10-CM | POA: Diagnosis not present

## 2024-05-21 DIAGNOSIS — Z87891 Personal history of nicotine dependence: Secondary | ICD-10-CM | POA: Diagnosis not present

## 2024-05-21 LAB — CBC WITH DIFFERENTIAL/PLATELET
Abs Immature Granulocytes: 0.06 K/uL (ref 0.00–0.07)
Basophils Absolute: 0 K/uL (ref 0.0–0.1)
Basophils Relative: 0 %
Eosinophils Absolute: 0 K/uL (ref 0.0–0.5)
Eosinophils Relative: 0 %
HCT: 41.3 % (ref 36.0–46.0)
Hemoglobin: 14 g/dL (ref 12.0–15.0)
Immature Granulocytes: 1 %
Lymphocytes Relative: 12 %
Lymphs Abs: 1.6 K/uL (ref 0.7–4.0)
MCH: 30.2 pg (ref 26.0–34.0)
MCHC: 33.9 g/dL (ref 30.0–36.0)
MCV: 89 fL (ref 80.0–100.0)
Monocytes Absolute: 0.9 K/uL (ref 0.1–1.0)
Monocytes Relative: 7 %
Neutro Abs: 10.6 K/uL — ABNORMAL HIGH (ref 1.7–7.7)
Neutrophils Relative %: 80 %
Platelets: 322 K/uL (ref 150–400)
RBC: 4.64 MIL/uL (ref 3.87–5.11)
RDW: 11.9 % (ref 11.5–15.5)
WBC: 13.2 K/uL — ABNORMAL HIGH (ref 4.0–10.5)
nRBC: 0 % (ref 0.0–0.2)

## 2024-05-21 MED ORDER — LACTATED RINGERS IV BOLUS
1000.0000 mL | Freq: Once | INTRAVENOUS | Status: AC
  Administered 2024-05-21: 1000 mL via INTRAVENOUS

## 2024-05-21 MED ORDER — DEXAMETHASONE SODIUM PHOSPHATE 10 MG/ML IJ SOLN
10.0000 mg | Freq: Once | INTRAMUSCULAR | Status: AC
  Administered 2024-05-21: 10 mg via INTRAVENOUS
  Filled 2024-05-21: qty 1

## 2024-05-21 NOTE — ED Provider Notes (Incomplete)
 [place] EMERGENCY DEPARTMENT AT [facility] Provider Note   CSN: [id number] Arrival date & time: 05/21/24  2120     Patient presents with: Post-op Problem   Becky King is a 53 y.o. female.  {Add pertinent medical, surgical, social history, OB history to YEP:67052} Patient presents to the emergency department for evaluation of difficulty swallowing.  Patient had tonsillectomy performed 4 days ago.  She has not been able to eat or drink since the procedure.  Calls to her ENT doctor were not returned today.       Prior to Admission medications   Medication Sig Start Date End Date Taking? Authorizing Provider  acetaminophen  (TYLENOL ) 500 MG tablet Take 1 tablet (500 mg total) by mouth every 6 (six) hours. Take every 6 hrs x 2-3 days then take every 6 hrs as needed 05/18/24   Soldatova, Liuba, MD  albuterol  (VENTOLIN  HFA) 108 (90 Base) MCG/ACT inhaler Inhale 1-2 puffs into the lungs every 6 (six) hours as needed for wheezing or shortness of breath.    [provider]  clobetasol cream (TEMOVATE) 0.05 % Apply 1 Application topically 2 (two) times daily as needed (psoriasis). 11/08/23   [provider]  Cyanocobalamin  (VITAMIN B 12 PO) Take 1 tablet by mouth daily.    [provider]  Emollient (EUCERIN) lotion Apply 1 Application topically as needed for dry skin.    [provider]  estradiol  (ESTRACE  VAGINAL) 0.1 MG/GM vaginal cream Place 1 g vaginally 2 (two) times a week. Patient taking differently: Place 1 g vaginally See admin instructions. Twice a week if needed for dryness 10/18/22   Prentiss Riggs A, NP  fexofenadine (ALLEGRA) 180 MG tablet Take 180 mg by mouth daily.    [provider]  fluticasone  (FLONASE ) 50 MCG/ACT nasal spray Place 1 spray into both nostrils daily as needed for allergies or rhinitis.    [provider]  ibuprofen  (ADVIL ) 600 MG tablet Take 1 tablet (600 mg total) by mouth every 6 (six)  hours. Please take every 6 hrs, and stagger this medication 3 hrs apart from Tylenol  05/18/24   Soldatova, Liuba, MD  oxyCODONE  (ROXICODONE ) 5 MG immediate release tablet Take 1 tablet (5 mg total) by mouth every 6 (six) hours as needed (severe pain after tonsillectomy not controlled with Tylenol  Motrin ). 05/18/24   Soldatova, Liuba, MD  polyvinyl alcohol (LIQUIFILM TEARS) 1.4 % ophthalmic solution Place 1 drop into both eyes as needed for dry eyes. [employer] Lomb For dry eyes    [provider]  predniSONE (DELTASONE) 10 MG tablet Take 2 tablets (20 mg total) by mouth daily with breakfast for 3 days. 05/20/24 05/23/24  Tobie Eldora NOVAK, MD    Allergies: Latex    Review of Systems  Updated Vital Signs BP (!) 91/38 (BP Location: Right Arm)   Pulse (!) 108   Temp 98.3 F (36.8 C)   Resp 18   Ht 5' 3 (1.6 m)   Wt 89.8 kg   LMP 01/12/2018   SpO2 94%   BMI 35.07 kg/m   Physical Exam Vitals and nursing note reviewed.  Constitutional:      General: She is not in acute distress.    Appearance: She is well-developed.  HENT:     Head: Normocephalic and atraumatic.     Mouth/Throat:     Mouth: Mucous membranes are moist.     Comments: Expected white tissue in surgical bed of bilateral tonsillectomy, no significant swelling, no bleeding  Eyes:     General: Vision grossly intact. Gaze aligned appropriately.     Extraocular Movements: Extraocular movements intact.     Conjunctiva/sclera: Conjunctivae normal.  Cardiovascular:     Rate and Rhythm: Normal rate and regular rhythm.     Pulses: Normal pulses.     Heart sounds: Normal heart sounds, S1 normal and S2 normal. No murmur heard.    No friction rub. No gallop.  Pulmonary:     Effort: Pulmonary effort is normal. No respiratory distress.     Breath sounds: Normal breath sounds.  Abdominal:     General: Bowel sounds are normal.     Palpations: Abdomen is soft.     Tenderness: There is no abdominal tenderness. There is no  guarding or rebound.     Hernia: No hernia is present.  Musculoskeletal:        General: No swelling.     Cervical back: Full passive range of motion without pain, normal range of motion and neck supple. No spinous process tenderness or muscular tenderness. Normal range of motion.     Right lower leg: No edema.     Left lower leg: No edema.  Skin:    General: Skin is warm and dry.     Capillary Refill: Capillary refill takes less than 2 seconds.     Findings: No ecchymosis, erythema, rash or wound.  Neurological:     General: No focal deficit present.     Mental Status: She is alert and oriented to person, place, and time.     GCS: GCS eye subscore is 4. GCS verbal subscore is 5. GCS motor subscore is 6.     Cranial Nerves: Cranial nerves 2-12 are intact.     Sensory: Sensation is intact.     Motor: Motor function is intact.     Coordination: Coordination is intact.  Psychiatric:        Attention and Perception: Attention normal.        Mood and Affect: Mood normal.        Speech: Speech normal.        Behavior: Behavior normal.     (all labs ordered are listed, but only abnormal results are displayed) Labs Reviewed  CBC WITH DIFFERENTIAL/PLATELET  BASIC METABOLIC PANEL WITH GFR    EKG: None  Radiology: No results found.  {Document cardiac monitor, telemetry assessment procedure when appropriate:32947} Procedures   Medications Ordered in the ED  lactated ringers  bolus 1,000 mL (has no administration in time range)  dexamethasone  (DECADRON ) injection 10 mg (has no administration in time range)      {Click here for ABCD2, HEART and other calculators REFRESH Note before signing:1}                              Medical Decision Making Amount and/or Complexity of Data Reviewed Labs: ordered.  Risk Prescription drug management.   ***  {Document critical care time when appropriate  Document review of labs and clinical decision tools ie CHADS2VASC2, etc  Document  your independent review of radiology images and any outside records  Document your discussion with family members, caretakers and with consultants  Document social determinants of health affecting pt's care  Document your decision making why or why not admission, treatments were needed:32947:::1}   Final diagnoses:  None    ED Discharge Orders     None

## 2024-05-21 NOTE — Anesthesia Postprocedure Evaluation (Signed)
 Anesthesia Post Note  Patient: Becky King  Procedure(s) Performed: BRONCHOSCOPY, RIGID (Mouth) TONSILLECTOMY (Bilateral: Mouth)     Patient location during evaluation: PACU Anesthesia Type: General Level of consciousness: awake and alert Pain management: pain level controlled Vital Signs Assessment: post-procedure vital signs reviewed and stable Respiratory status: spontaneous breathing, nonlabored ventilation, respiratory function stable and patient connected to nasal cannula oxygen Cardiovascular status: blood pressure returned to baseline and stable Postop Assessment: no apparent nausea or vomiting Anesthetic complications: no   No notable events documented.  Last Vitals:  Vitals:   05/18/24 1400 05/18/24 1415  BP: (!) 128/54 124/82  Pulse: (!) 55 (!) 54  Resp: 17 18  Temp:  36.6 C  SpO2: 95% 95%    Last Pain:  Vitals:   05/18/24 1414  TempSrc:   PainSc: 4    Pain Goal:                   [name]

## 2024-05-21 NOTE — ED Triage Notes (Addendum)
 Pt states had tonsillectomy on Friday  States unable to sip liquids without choking Managing secretions well, no SOB  No fever   Took oxycodone  2 hours pta

## 2024-05-22 ENCOUNTER — Emergency Department (HOSPITAL_BASED_OUTPATIENT_CLINIC_OR_DEPARTMENT_OTHER)

## 2024-05-22 ENCOUNTER — Encounter (HOSPITAL_BASED_OUTPATIENT_CLINIC_OR_DEPARTMENT_OTHER): Payer: Self-pay

## 2024-05-22 DIAGNOSIS — J0510 Acute epiglottitis without obstruction: Secondary | ICD-10-CM | POA: Diagnosis present

## 2024-05-22 DIAGNOSIS — I959 Hypotension, unspecified: Secondary | ICD-10-CM | POA: Diagnosis not present

## 2024-05-22 DIAGNOSIS — R1310 Dysphagia, unspecified: Secondary | ICD-10-CM | POA: Diagnosis present

## 2024-05-22 DIAGNOSIS — J0431 Supraglottitis, unspecified, with obstruction: Secondary | ICD-10-CM | POA: Diagnosis present

## 2024-05-22 DIAGNOSIS — Z743 Need for continuous supervision: Secondary | ICD-10-CM | POA: Diagnosis not present

## 2024-05-22 DIAGNOSIS — G8918 Other acute postprocedural pain: Secondary | ICD-10-CM | POA: Diagnosis present

## 2024-05-22 LAB — CREATININE, SERUM
Creatinine, Ser: 0.54 mg/dL (ref 0.44–1.00)
GFR, Estimated: >60 mL/min (ref >60)

## 2024-05-22 LAB — CBC
HCT: 40.7 % (ref 36.0–46.0)
Hemoglobin: 13.7 g/dL (ref 12.0–15.0)
MCH: 29.8 pg (ref 26.0–34.0)
MCHC: 33.7 g/dL (ref 30.0–36.0)
MCV: 88.7 fL (ref 80.0–100.0)
Platelets: 333 K/uL (ref 150–400)
RBC: 4.59 MIL/uL (ref 3.87–5.11)
RDW: 11.8 % (ref 11.5–15.5)
WBC: 10.6 K/uL — ABNORMAL HIGH (ref 4.0–10.5)
nRBC: 0 % (ref 0.0–0.2)

## 2024-05-22 LAB — BASIC METABOLIC PANEL WITH GFR
Anion gap: 17 — ABNORMAL HIGH (ref 5–15)
BUN: 23 mg/dL — ABNORMAL HIGH (ref 6–20)
CO2: 20 mmol/L — ABNORMAL LOW (ref 22–32)
Calcium: 9.5 mg/dL (ref 8.9–10.3)
Chloride: 102 mmol/L (ref 98–111)
Creatinine, Ser: 0.47 mg/dL (ref 0.44–1.00)
GFR, Estimated: >60 mL/min (ref >60)
Glucose, Bld: 88 mg/dL (ref 70–99)
Potassium: 3.8 mmol/L (ref 3.5–5.1)
Sodium: 140 mmol/L (ref 135–145)

## 2024-05-22 MED ORDER — DEXAMETHASONE SODIUM PHOSPHATE 10 MG/ML IJ SOLN
8.0000 mg | Freq: Three times a day (TID) | INTRAMUSCULAR | Status: DC
  Administered 2024-05-22 – 2024-05-23 (×3): 8 mg via INTRAVENOUS
  Filled 2024-05-22 (×4): qty 0.8

## 2024-05-22 MED ORDER — KETOROLAC TROMETHAMINE 15 MG/ML IJ SOLN
15.0000 mg | Freq: Three times a day (TID) | INTRAMUSCULAR | Status: DC
  Administered 2024-05-22 (×2): 15 mg via INTRAVENOUS
  Filled 2024-05-22 (×2): qty 1

## 2024-05-22 MED ORDER — ACETAMINOPHEN 10 MG/ML IV SOLN
1000.0000 mg | Freq: Four times a day (QID) | INTRAVENOUS | Status: DC
  Administered 2024-05-22: 1000 mg via INTRAVENOUS
  Filled 2024-05-22 (×2): qty 100

## 2024-05-22 MED ORDER — IBUPROFEN 200 MG PO TABS
600.0000 mg | ORAL_TABLET | Freq: Four times a day (QID) | ORAL | Status: DC
  Administered 2024-05-22 – 2024-05-23 (×3): 600 mg via ORAL
  Filled 2024-05-22 (×3): qty 3

## 2024-05-22 MED ORDER — MORPHINE SULFATE (PF) 2 MG/ML IV SOLN: 2.0000 mg | INTRAVENOUS | Status: DC | PRN

## 2024-05-22 MED ORDER — OXYCODONE HCL 5 MG PO TABS
5.0000 mg | ORAL_TABLET | ORAL | Status: DC | PRN
Start: 2024-05-22 — End: 2024-05-23

## 2024-05-22 MED ORDER — DEXAMETHASONE SODIUM PHOSPHATE 4 MG/ML IJ SOLN
4.0000 mg | Freq: Four times a day (QID) | INTRAMUSCULAR | Status: DC
  Administered 2024-05-22: 4 mg via INTRAVENOUS
  Filled 2024-05-22: qty 1

## 2024-05-22 MED ORDER — SODIUM CHLORIDE 0.9 % IV SOLN
3.0000 g | Freq: Four times a day (QID) | INTRAVENOUS | Status: DC
  Administered 2024-05-22: 3 g via INTRAVENOUS
  Filled 2024-05-22: qty 8

## 2024-05-22 MED ORDER — ORAL CARE MOUTH RINSE: 15.0000 mL | OROMUCOSAL | Status: DC | PRN

## 2024-05-22 MED ORDER — IOHEXOL 300 MG/ML  SOLN
75.0000 mL | Freq: Once | INTRAMUSCULAR | Status: AC | PRN
  Administered 2024-05-22: 75 mL via INTRAVENOUS

## 2024-05-22 MED ORDER — ENOXAPARIN SODIUM 40 MG/0.4ML IJ SOSY
40.0000 mg | PREFILLED_SYRINGE | INTRAMUSCULAR | Status: DC
  Administered 2024-05-22: 40 mg via SUBCUTANEOUS
  Filled 2024-05-22: qty 0.4

## 2024-05-22 MED ORDER — PANTOPRAZOLE SODIUM 40 MG IV SOLR
40.0000 mg | Freq: Two times a day (BID) | INTRAVENOUS | Status: DC
  Administered 2024-05-22 (×2): 40 mg via INTRAVENOUS
  Filled 2024-05-22 (×3): qty 10

## 2024-05-22 MED ORDER — VANCOMYCIN HCL 1750 MG/350ML IV SOLN
1750.0000 mg | Freq: Once | INTRAVENOUS | Status: AC
  Administered 2024-05-22: 1750 mg via INTRAVENOUS
  Filled 2024-05-22: qty 350

## 2024-05-22 MED ORDER — SODIUM CHLORIDE 0.9 % IV SOLN: INTRAVENOUS | Status: AC

## 2024-05-22 MED ORDER — HYDROMORPHONE HCL 1 MG/ML IJ SOLN: 0.2500 mg | INTRAMUSCULAR | Status: DC | PRN

## 2024-05-22 MED ORDER — VANCOMYCIN HCL 1250 MG/250ML IV SOLN: 1250.0000 mg | INTRAVENOUS | Status: DC

## 2024-05-22 MED ORDER — ONDANSETRON HCL 4 MG/2ML IJ SOLN
4.0000 mg | Freq: Once | INTRAMUSCULAR | Status: AC
Start: 2024-05-22 — End: 2024-05-22
  Administered 2024-05-22: 4 mg via INTRAVENOUS
  Filled 2024-05-22: qty 2

## 2024-05-22 MED ORDER — MORPHINE SULFATE (PF) 4 MG/ML IV SOLN
4.0000 mg | Freq: Once | INTRAVENOUS | Status: AC
  Administered 2024-05-22: 4 mg via INTRAVENOUS
  Filled 2024-05-22: qty 1

## 2024-05-22 MED ORDER — ACETAMINOPHEN 500 MG PO TABS
500.0000 mg | ORAL_TABLET | Freq: Four times a day (QID) | ORAL | Status: DC
  Administered 2024-05-22 – 2024-05-23 (×3): 500 mg via ORAL
  Filled 2024-05-22 (×3): qty 1

## 2024-05-22 MED ORDER — SODIUM CHLORIDE 0.9 % IV SOLN
3.0000 g | Freq: Four times a day (QID) | INTRAVENOUS | Status: DC
  Administered 2024-05-22 – 2024-05-23 (×3): 3 g via INTRAVENOUS
  Filled 2024-05-22 (×4): qty 8

## 2024-05-22 MED ORDER — SODIUM CHLORIDE 0.9 % IV SOLN: 2.0000 g | INTRAVENOUS | Status: DC

## 2024-05-22 MED ORDER — SODIUM CHLORIDE 0.9 % IV SOLN
2.0000 g | Freq: Once | INTRAVENOUS | Status: AC
  Administered 2024-05-22: 2 g via INTRAVENOUS
  Filled 2024-05-22: qty 20

## 2024-05-22 NOTE — H&P (Signed)
 History and Physical    Patient: Becky King FMW:[id number] DOB: Jan 15, 1971 DOA: 05/21/2024 DOS: the patient was seen and examined on 05/22/2024 PCP: Pcp, No  Patient coming from: Home  Chief Complaint:  Chief Complaint  Patient presents with   Post-op Problem   HPI: Becky King is a 53 y.o. female with medical history significant of s/p tonisllectomy on 9/26 per ENT p/w dysphagia and CT imaging c/f epiglottitis.  The patient reported having difficulty swallowing following a tonsillectomy performed on Friday. The patient described the sensation of drowning when attempting to drink liquids or consume soups, as if the liquid was being absorbed slowly like a sponge. The patient contacted the on-call medical team on Saturday, who initially reassured them that the symptoms were normal. However, upon calling again on Sunday, the patient was prescribed prednisone to help with swallowing difficulties. Despite taking three doses of the medication, the patient reported that the symptoms did not improve. The patient had to crush the prednisone tablets and dissolve them in water to ingest them, which may have affected the dosage received. On Monday, the patient attempted to contact the surgical team four times without receiving a response; thus, she presented to the ED.  In the ED, pt AFVSS. Labs notable for WBC 13 (pt on oral steroids per OP ENT). OSH ED to ED transfer for urgent ENT evaluation and medicine PCU requested; however, CCM consulted given c/f impending airway compromise and need for ICU.   Review of Systems: As mentioned in the history of present illness. All other systems reviewed and are negative. Past Medical History:  Diagnosis Date   Allergy    Anemia    Asthma    HAS MDI   Carpal tunnel syndrome    CARPAL TUNNEL WITH SURGERY    Gallstone pancreatitis 2025   Kidney stones    PONV (postoperative nausea and vomiting)    Seasonal allergies    Vitamin B12 deficiency  07/30/2019   Past Surgical History:  Procedure Laterality Date   APPENDECTOMY     CHOLECYSTECTOMY N/A 01/14/2024   Procedure: LAPAROSCOPIC CHOLECYSTECTOMY WITH INTRAOPERATIVE CHOLANGIOGRAM;  Surgeon: Polly Cordella LABOR, MD;  [facility]: [facility] OR;  Service: General;  Laterality: N/A;   HERNIA REPAIR     RIGID BRONCHOSCOPY N/A 05/18/2024   Procedure: BRONCHOSCOPY, RIGID;  Surgeon: Okey Burns, MD;  Location: [facility] OR;  Service: ENT;  Laterality: N/A;  DML bronchoscopy biopsy of the base of the tongue   TONSILLECTOMY Bilateral 05/18/2024   Procedure: TONSILLECTOMY;  Surgeon: Okey Burns, MD;  Location: [facility] OR;  Service: ENT;  Laterality: Bilateral;   TUBAL LIGATION     Social History:  reports that she quit smoking about 1 months ago. Her smoking use included cigarettes. She has never used smokeless tobacco. She reports current alcohol use. She reports that she does not use drugs.  Allergies  Allergen Reactions   Latex Itching, Rash and Other (See Comments)    Cause blisters also    Family History  Problem Relation Age of Onset   Fibromyalgia Mother    Heart disease Mother    Colon polyps Mother    Diabetes Maternal Grandmother    Colon cancer Neg Hx    Esophageal cancer Neg Hx    Stomach cancer Neg Hx    Rectal cancer Neg Hx     Prior to Admission medications   Medication Sig Start Date End Date Taking? Authorizing Provider  acetaminophen  (TYLENOL ) 500 MG tablet Take 1 tablet (500  mg total) by mouth every 6 (six) hours. Take every 6 hrs x 2-3 days then take every 6 hrs as needed 05/18/24  Yes Okey Burns, MD  albuterol  (VENTOLIN  HFA) 108 (90 Base) MCG/ACT inhaler Inhale 1-2 puffs into the lungs every 6 (six) hours as needed for wheezing or shortness of breath.   Yes [provider]  clobetasol cream (TEMOVATE) 0.05 % Apply 1 Application topically 2 (two) times daily as needed (psoriasis). 11/08/23  Yes [provider]  Cyanocobalamin  (VITAMIN B 12 PO) Take 1  tablet by mouth daily.   Yes [provider]  Emollient (EUCERIN) lotion Apply 1 Application topically as needed for dry skin.   Yes [provider]  estradiol  (ESTRACE  VAGINAL) 0.1 MG/GM vaginal cream Place 1 g vaginally 2 (two) times a week. Patient taking differently: Place 1 g vaginally See admin instructions. Twice a week if needed for dryness 10/18/22  Yes Wallace, Tiffany A, NP  fexofenadine (ALLEGRA) 180 MG tablet Take 180 mg by mouth daily.   Yes [provider]  fluticasone  (FLONASE ) 50 MCG/ACT nasal spray Place 1 spray into both nostrils daily as needed for allergies or rhinitis.   Yes [provider]  ibuprofen  (ADVIL ) 600 MG tablet Take 1 tablet (600 mg total) by mouth every 6 (six) hours. Please take every 6 hrs, and stagger this medication 3 hrs apart from Tylenol  05/18/24  Yes Soldatova, Liuba, MD  oxyCODONE  (ROXICODONE ) 5 MG immediate release tablet Take 1 tablet (5 mg total) by mouth every 6 (six) hours as needed (severe pain after tonsillectomy not controlled with Tylenol  Motrin ). 05/18/24  Yes Soldatova, Liuba, MD  polyvinyl alcohol (LIQUIFILM TEARS) 1.4 % ophthalmic solution Place 1 drop into both eyes as needed for dry eyes. [employer] For dry eyes   Yes [provider]  predniSONE (DELTASONE) 10 MG tablet Take 2 tablets (20 mg total) by mouth daily with breakfast for 3 days. 05/20/24 05/23/24 Yes Tobie Eldora NOVAK, MD    Physical Exam: Vitals:   05/22/24 0245 05/22/24 0430 05/22/24 0500 05/22/24 0608  BP: 117/62 (!) 110/54 (!) 115/50   Pulse: 85 (!) 51 71   Resp: 16  17   Temp:   98 F (36.7 C) 97.8 F (36.6 C)  TempSrc:   Oral Oral  SpO2: 94% 93% 95%   Weight:      Height:       General: Alert, oriented x3, resting comfortably in no acute distress HEENT: EOMI, oropharynx clear, moist mucous membranes, hearing intact Neck: Trachea midline and no gross thyromegaly Respiratory: Lungs clear to auscultation bilaterally with  normal respiratory effort; no w/r/r Cardiovascular: Regular rate and rhythm w/o m/r/g Abdomen: Soft, nontender, nondistended. Positive bowel sounds MSK: No obvious joint deformities or swelling Skin: No obvious rashes or lesions Neurologic: Awake, alert, spontaneously moves all extremities, strength intact Psychiatric: Appropriate mood and affect, conversational and cooperative   Data Reviewed:  Lab Results  Component Value Date   WBC 10.6 (H) 05/22/2024   HGB 13.7 05/22/2024   HCT 40.7 05/22/2024   MCV 88.7 05/22/2024   PLT 333 05/22/2024   Lab Results  Component Value Date   GLUCOSE 88 05/21/2024   CALCIUM 9.5 05/21/2024   NA 140 05/21/2024   K 3.8 05/21/2024   CO2 20 (L) 05/21/2024   CL 102 05/21/2024   BUN 23 (H) 05/21/2024   CREATININE 0.54 05/22/2024   Lab Results  Component Value Date   ALT 18 05/18/2024  AST 20 05/18/2024   ALKPHOS 44 05/18/2024   BILITOT 0.7 05/18/2024   Lab Results  Component Value Date   INR 1.0 01/13/2024   Radiology: CT Soft Tissue Neck W Contrast Result Date: 05/22/2024 CLINICAL DATA:  Initial evaluation for difficulty swallowing, recent tonsillectomy. EXAM: CT NECK WITH CONTRAST TECHNIQUE: Multidetector CT imaging of the neck was performed using the standard protocol following the bolus administration of intravenous contrast. RADIATION DOSE REDUCTION: This exam was performed according to the departmental dose-optimization program which includes automated exposure control, adjustment of the mA and/or kV according to patient size and/or use of iterative reconstruction technique. CONTRAST:  75mL OMNIPAQUE  IOHEXOL  300 MG/ML  SOLN COMPARISON:  None Available. FINDINGS: Pharynx and larynx: Oral cavity within normal limits. Postoperative changes from recent bilateral tonsillectomy. Small volume fluid and gas within the resection cavities. No loculated collections. Mucosal edema within the adjacent oropharyngeal mucosa with hazy stranding within the  adjacent parapharyngeal fat. Trace layering retropharyngeal effusion without retropharyngeal collection. Epiglottis is thickened and edematous as are the aryepiglottic folds. Vallecula effaced by the lingual tonsils. While these findings may in part be postoperative in nature, possible changes of supraglottitis could also be contributory. There is secondary narrowing of the supraglottic airway at the level of the epiglottis, measuring 2 mm in AP diameter at its most narrow point (series 5, image 49). There is an apparent 3-4 mm polypoid lesion extending laterally from the right aryepiglottic fold, protruding into the piriform sinus (series 2, image 52). Findings indeterminate, but could reflect a small polyp. Glottis within normal limits inferiorly. Subglottic airway clear. Salivary glands: Salivary glands including the parotid and submandibular glands are within normal limits. Thyroid: Normal. Lymph nodes: Mildly prominent 1.2 cm right level 2 lymph node, nonspecific, but could be reactive. No other enlarged or pathologic adenopathy within the neck. Vascular: Normal intravascular enhancement seen within the neck. Mild atheromatous change noted about the carotid bifurcations. Limited intracranial: Unremarkable. Visualized orbits: Unremarkable. Mastoids and visualized paranasal sinuses: Mild mucosal thickening present about the ethmoidal air cells and left maxillary sinus. Small chronic appearing bilateral mastoid effusions, of doubtful significance. Middle ear cavities are clear. Skeleton: No worrisome osseous lesions.  Poor dentition noted. Upper chest: Scattered patchy ground-glass opacity within the partially visualized right upper lobe, likely mild infection/pneumonitis (series 3, image 96). Other: None. IMPRESSION: 1. Postoperative changes from recent bilateral tonsillectomy. Small volume fluid and gas within the resection cavities, but with no loculated collections or other postoperative complication. 2. Edema  about the oropharyngeal mucosa with thickening of the epiglottis and aryepiglottic folds. While these findings may in part be postoperative in nature/intubation related, possible superimposed/concomitant changes of acute supraglottitis could be considered in the correct clinical setting. Secondary narrowing of the supraglottic airway which measures 2 mm in AP diameter at its most narrow point. Close clinical monitoring recommended, as this patient is potentially at risk for developing airway compromise. 3. Scattered patchy ground-glass opacity within the partially visualized right upper lobe, likely mild infection/pneumonitis. 4. 3-4 mm polypoid lesion extending laterally from the right aryepiglottic fold, protruding into the piriform sinus. Finding is indeterminate, but could reflect a small polyp. Correlation with direct inspection recommended. Results were called by telephone at the time of interpretation on 05/22/2024 at 2:13 am to provider [facility] , who verbally acknowledged these results. Electronically Signed   By: Morene Hoard M.D.   On: 05/22/2024 02:16    Assessment and Plan: 20F s/p tonisllectomy on 9/26 per ENT p/w dysphagia and CT  imaging c/f epiglottitis.  Odynophagia s/p tonsillectomy Possible epiglottitis -CCM consulted; apprec eval/recs (PCU OK) -ENT consulted; apprec eva/recs (see note on 9/30) -IV Unasyn per ENT -IV decadron  8mg  q8h per ENT -IV NS at 100cc/h -PO tylenol  500 QID, ibuprofen  600mg  QID, and oxycodone  5mg  q4h prn for pain control; OK to crush per ENT   Advance Care Planning:   Code Status: Full Code   Consults: CCM and ENT  Family Communication: Son  Severity of Illness: The appropriate patient status for this patient is INPATIENT. Inpatient status is judged to be reasonable and necessary in order to provide the required intensity of service to ensure the patient's safety. The patient's presenting symptoms, physical exam findings, and initial  radiographic and laboratory data in the context of their chronic comorbidities is felt to place them at high risk for further clinical deterioration. Furthermore, it is not anticipated that the patient will be medically stable for discharge from the hospital within 2 midnights of admission.   * I certify that at the point of admission it is my clinical judgment that the patient will require inpatient hospital care spanning beyond 2 midnights from the point of admission due to high intensity of service, high risk for further deterioration and high frequency of surveillance required.*   ------- I spent 56 minutes reviewing previous notes, at the bedside counseling/discussing the treatment plan, and performing clinical documentation.  Author: Marsha Ada, MD 05/22/2024 7:35 AM  For on call review [email or web address].

## 2024-05-22 NOTE — Consult Note (Signed)
 ENT CONSULT:  Reason for Consult: concern for epiglottitis s/p biopsy along base of the tongue    HPI: 51 yoF hx of prominent lingual tonsils and base of the tongue asymmetry/tonsillar hypertrophy who underwent tonsillectomy and biopsy in the OR 05/18/24, who presented to ED with trouble swallowing and pain, with CT revealing thickening of the epiglottis.     Past Medical History:  Diagnosis Date   Allergy    Anemia    Asthma    HAS MDI   Carpal tunnel syndrome    CARPAL TUNNEL WITH SURGERY    Gallstone pancreatitis 2025   Kidney stones    PONV (postoperative nausea and vomiting)    Seasonal allergies    Vitamin B12 deficiency 07/30/2019    Past Surgical History:  Procedure Laterality Date   APPENDECTOMY     CHOLECYSTECTOMY N/A 01/14/2024   Procedure: LAPAROSCOPIC CHOLECYSTECTOMY WITH INTRAOPERATIVE CHOLANGIOGRAM;  Surgeon: Polly Cordella LABOR, MD;  Location: [facility] OR;  Service: General;  Laterality: N/A;   HERNIA REPAIR     RIGID BRONCHOSCOPY N/A 05/18/2024   Procedure: BRONCHOSCOPY, RIGID;  Surgeon: Okey Burns, MD;  Location: [facility] OR;  Service: ENT;  Laterality: N/A;  DML bronchoscopy biopsy of the base of the tongue   TONSILLECTOMY Bilateral 05/18/2024   Procedure: TONSILLECTOMY;  Surgeon: Okey Burns, MD;  Location: [facility] OR;  Service: ENT;  Laterality: Bilateral;   TUBAL LIGATION      Family History  Problem Relation Age of Onset   Fibromyalgia Mother    Heart disease Mother    Colon polyps Mother    Diabetes Maternal Grandmother    Colon cancer Neg Hx    Esophageal cancer Neg Hx    Stomach cancer Neg Hx    Rectal cancer Neg Hx     Social History:  reports that she quit smoking about 1 months ago. Her smoking use included cigarettes. She has never used smokeless tobacco. She reports current alcohol use. She reports that she does not use drugs.  Allergies:  Allergies  Allergen Reactions   Latex Itching, Rash and Other (See Comments)    Cause blisters also     Medications: I have reviewed the patient's current medications.  The PMH, PSH, Medications, Allergies, and SH were reviewed and updated.  ROS: Constitutional: Negative for fever, weight loss and weight gain. Cardiovascular: Negative for chest pain and dyspnea on exertion. Respiratory: Is not experiencing shortness of breath at rest. Gastrointestinal: Negative for nausea and vomiting. Neurological: Negative for headaches. Psychiatric: The patient is not nervous/anxious  Blood pressure (!) 115/50, pulse 71, temperature 97.8 F (36.6 C), temperature source Oral, resp. rate 17, height 5' 3 (1.6 m), weight 89.8 kg, last menstrual period 01/12/2018, SpO2 95%. Body mass index is 35.07 kg/m.  PHYSICAL EXAM:  Exam: General: Well-developed, well-nourished Communication and Voice: Clear pitch and clarity Respiratory Respiratory effort: Equal inspiration and expiration without stridor Cardiovascular Peripheral Vascular: Warm extremities with equal color/perfusion Eyes: No nystagmus with equal extraocular motion bilaterally Neuro/Psych/Balance: Patient oriented to person, place, and time; Appropriate mood and affect; Gait is intact with no imbalance; Cranial nerves I-XII are intact Head and Face Inspection: Normocephalic and atraumatic without mass or lesion Palpation: Facial skeleton intact without bony stepoffs Salivary Glands: No mass or tenderness Facial Strength: Facial motility symmetric and full bilaterally ENT Pinna: External ear intact and fully developed External canal: Canal is patent with intact skin Tympanic Membrane: Clear and mobile External Nose: No scar or anatomic deformity Internal Nose: Septum is  deviated to the left. No polyp, or purulence. Mucosal edema and erythema present.  Bilateral inferior turbinate hypertrophy.  Lips, Teeth, and gums: Mucosa and teeth intact and viable TMJ: No pain to palpation with full mobility Oral cavity/oropharynx: No erythema or  exudate, no lesions present Nasopharynx: No mass or lesion with intact mucosa Hypopharynx: Intact mucosa without pooling of secretions Larynx Glottic: Full true vocal cord mobility without lesion or mass Supraglottic: Normal appearing epiglottis and AE folds no edema, inflammation and eschar along left pharyngeal wall and vallecula where biopsies were taken  Interarytenoid Space: Moderate pachydermia&edema Subglottic Space: Patent without lesion or edema Neck Neck and Trachea: Midline trachea without mass or lesion Thyroid: No mass or nodularity Lymphatics: No lymphadenopathy  Procedure: Preoperative diagnosis: concern for airway narrowing epiglottitis   Postoperative diagnosis:   Same mild edema and eschar along left base of the tongue and vallecula where biopsies were taken. No supraglottic swelling. Glottic airway is patent.   Procedure: Flexible fiberoptic laryngoscopy  Surgeon: Tyrrell Stephens, MD  Anesthesia: Topical lidocaine  and Afrin Complications: None Condition is stable throughout exam  Indications and consent:  The patient presents to the clinic with above symptoms. Indirect laryngoscopy view was incomplete. Thus it was recommended that they undergo a flexible fiberoptic laryngoscopy. All of the risks, benefits, and potential complications were reviewed with the patient preoperatively and verbal informed consent was obtained.  Procedure: The patient was seated upright in the clinic. Topical lidocaine  and Afrin were applied to the nasal cavity. After adequate anesthesia had occurred, I then proceeded to pass the flexible telescope into the nasal cavity. The nasal cavity was patent without rhinorrhea or polyp. The nasopharynx was also patent without mass or lesion. The base of tongue was visualized and was normal. There were no signs of pooling of secretions in the piriform sinuses. The true vocal folds were mobile bilaterally. There were no signs of glottic or supraglottic  mucosal lesion or mass. There was moderate interarytenoid pachydermia and post cricoid edema. The telescope was then slowly withdrawn and the patient tolerated the procedure throughout.   Studies Reviewed: CT neck today  IMPRESSION: 1. Postoperative changes from recent bilateral tonsillectomy. Small volume fluid and gas within the resection cavities, but with no loculated collections or other postoperative complication. 2. Edema about the oropharyngeal mucosa with thickening of the epiglottis and aryepiglottic folds. While these findings may in part be postoperative in nature/intubation related, possible superimposed/concomitant changes of acute supraglottitis could be considered in the correct clinical setting. Secondary narrowing of the supraglottic airway which measures 2 mm in AP diameter at its most narrow point. Close clinical monitoring recommended, as this patient is potentially at risk for developing airway compromise. 3. Scattered patchy ground-glass opacity within the partially visualized right upper lobe, likely mild infection/pneumonitis. 4. 3-4 mm polypoid lesion extending laterally from the right aryepiglottic fold, protruding into the piriform sinus. Finding is indeterminate, but could reflect a small polyp. Correlation with direct inspection recommended.  Assessment/Plan: Odynophagia, trouble with swallowing after tonsillectomy and base of the tongue vallecular biopsy including lingual surface of epiglottis due to prominent lingual tonsils and asymmetry on the left side  Scope exam with inflammation and eschar along the base of the tongue, airway is patent   She is tolerating her own secretions  - IV Dex 8Q8 - PPI IV BID - pain control with Tylenol  500 mg Q6hrs, Ibuprofen  600 mg Q6hrs standing and Oxy 5 mg Q4-6hrs PRN - provide pill crusher to take medications  -  IV Unasyn to cover for post-op infection  - if able to tolerate start with liquid diet then advance to  soft pureed consistencies  - if tolerates PO home on above pain control and Bactrim DS tomorrow   Assessment and Plan       Thank you for allowing me to participate in the care of this patient. Please do not hesitate to contact me with any questions or concerns.   Elena Larry, MD Otolaryngology [facility] Phone: [phone] Fax: [phone]    05/22/2024, 8:23 AM

## 2024-05-22 NOTE — ED Notes (Signed)
 CCMD called. Pt placed on monitor.

## 2024-05-22 NOTE — Progress Notes (Addendum)
 Pharmacy Antibiotic Note  Becky King is a 53 y.o. female admitted on 05/21/2024 with Epiglottitis iso recent surgery .  Pharmacy has been consulted for Vancomycin dosing.  Plan: Vancomycin 1750 mg loading dose x1 Vancomycin 1250 mg Q24H - eAUC 472  Scr baseline of < 1. Continue to monitor renal function Vanc levels as needed  Height: 5' 3 (160 cm) Weight: 89.8 kg (197 lb 15.6 oz) IBW/kg (Calculated) : 52.4  Temp (24hrs), Avg:98 F (36.7 C), Min:97.8 F (36.6 C), Max:98.3 F (36.8 C)  Recent Labs  Lab 05/18/24 0958 05/18/24 0959 05/21/24 2350  WBC 7.0  --  13.2*  CREATININE  --  0.43* 0.47    Estimated Creatinine Clearance: 84.5 mL/min (by C-G formula based on SCr of 0.47 mg/dL).    Allergies  Allergen Reactions   Latex Itching, Rash and Other (See Comments)    Cause blisters also    Antimicrobials this admission: 9/30 Vancomycin >> Current 9/30 Ceftriaxone >> Current  Dose adjustments this admission: None  Microbiology results: None  Thank you for allowing pharmacy to be a part of this patient's care.  Prentice DOROTHA Favors, PharmD PGY1 Health-System Pharmacy Administration and Leadership Resident [facility]  05/22/2024 7:40 AM

## 2024-05-22 NOTE — ED Notes (Signed)
 Pt wheeled to bathroom

## 2024-05-22 NOTE — ED Notes (Signed)
 Attempted to call charge RN for rolling call, no answer at this time.

## 2024-05-22 NOTE — ED Notes (Signed)
 CCMD called to report transfer to RM [id number].

## 2024-05-22 NOTE — Progress Notes (Signed)
 Bid protonix added per ent request

## 2024-05-22 NOTE — Consult Note (Signed)
 NAME:  Becky King, MRN:  [id number], DOB:  06/21/1971, LOS: 0 ADMISSION DATE:  05/21/2024, CONSULTATION DATE:  05/22/24 REFERRING MD:  TRH, CHIEF COMPLAINT:  dysphagia   History of Present Illness:  53 year old woman with recurrent tonsilitis and abnormal tongue base lesion s/p tonsilectomy, epiglottic biopsy on 9/26 who is presenting with postoperative difficulty swallowing that has progressed to not being able to get anything down so came to ER.  PCCM consulted due to possible airway concerns. She has been scoped by Dr. Soldatova with ENT who also did the surgery.  Patient states already feeling a little better after IV steroids.  Abx have been started.  Pertinent  Medical History   Past Medical History:  Diagnosis Date   Allergy    Anemia    Asthma    HAS MDI   Carpal tunnel syndrome    CARPAL TUNNEL WITH SURGERY    Gallstone pancreatitis 2025   Kidney stones    PONV (postoperative nausea and vomiting)    Seasonal allergies    Vitamin B12 deficiency 07/30/2019     Significant Hospital Events: Including procedures, antibiotic start and stop dates in addition to other pertinent events   9/30 admit/consult  Interim History / Subjective:  Consult  Objective    Blood pressure (!) 115/50, pulse 71, temperature 97.8 F (36.6 C), temperature source Oral, resp. rate 17, height 5' 3 (1.6 m), weight 89.8 kg, last menstrual period 01/12/2018, SpO2 95%.       No intake or output data in the 24 hours ending 05/22/24 0817 Filed Weights   05/21/24 2124  Weight: 89.8 kg    Examination: General: no distress HENT: malampatti 2, inflammed oropharynx with ?plaques; defer to laryngoscopy note from ENT Lungs: clear, nonlabored Cardiovascular: regular, ext warm Abdomen: soft, +BS Extremities: no edema Neuro: garbled speech otherwise nonfocal GU: deferred  Labs/imaging reviewed, discussed case with ENT  Resolved problem list   Assessment and Plan  Postoperative  epiglottic swelling- tx for inflammation/infection; no signs or symptoms of airway compromise; has been in ER for 8 hours, states already feeling better.  Do not see role for ICU admission given laryngoscopy findings and subjective improvement but can re-eval if any worsening. - Okay for progressive admit - Unasyn, vanc; check MRSA PCR, DC vanc if MRSA PCR neg - Dexamethasone  8q8 per ENT - Pain control with standing tylenol  and toradol  with IV dilaudid  for breakthrough - Clear liquid diet, advance to mechanical soft later today if feels better - f/u surg path from 05/18/24 in ENT clinic - Will be available as needed  Labs   CBC: Recent Labs  Lab 05/18/24 0958 05/21/24 2350  WBC 7.0 13.2*  NEUTROABS  --  10.6*  HGB 13.5 14.0  HCT 41.0 41.3  MCV 89.1 89.0  PLT 301 322    Basic Metabolic Panel: Recent Labs  Lab 05/18/24 0959 05/21/24 2350  NA 140 140  K 3.8 3.8  CL 108 102  CO2 23 20*  GLUCOSE 97 88  BUN 12 23*  CREATININE 0.43* 0.47  CALCIUM 8.7* 9.5   GFR: Estimated Creatinine Clearance: 84.5 mL/min (by C-G formula based on SCr of 0.47 mg/dL). Recent Labs  Lab 05/18/24 0958 05/21/24 2350  WBC 7.0 13.2*    Liver Function Tests: Recent Labs  Lab 05/18/24 0959  AST 20  ALT 18  ALKPHOS 44  BILITOT 0.7  PROT 6.4*  ALBUMIN 3.5   No results for input(s): LIPASE, AMYLASE in the  last 168 hours. No results for input(s): AMMONIA in the last 168 hours.  ABG No results found for: PHART, PCO2ART, PO2ART, HCO3, TCO2, ACIDBASEDEF, O2SAT   Coagulation Profile: No results for input(s): INR, PROTIME in the last 168 hours.  Cardiac Enzymes: No results for input(s): CKTOTAL, CKMB, CKMBINDEX, TROPONINI in the last 168 hours.  HbA1C: No results found for: HGBA1C  CBG: No results for input(s): GLUCAP in the last 168 hours.  Review of Systems:    Positive Symptoms in bold:  Constitutional fevers, chills, weight loss, fatigue,  anorexia, malaise  Eyes decreased vision, double vision, eye irritation  Ears, Nose, Mouth, Throat sore throat, trouble swallowing, sinus congestion  Cardiovascular chest pain, paroxysmal nocturnal dyspnea, lower ext edema, palpitations   Respiratory SOB, cough, DOE, hemoptysis, wheezing  Gastrointestinal nausea, vomiting, diarrhea  Genitourinary burning with urination, trouble urinating  Musculoskeletal joint aches, joint swelling, back pain  Integumentary  rashes, skin lesions  Neurological focal weakness, focal numbness, trouble speaking, headaches  Psychiatric depression, anxiety, confusion  Endocrine polyuria, polydipsia, cold intolerance, heat intolerance  Hematologic abnormal bruising, abnormal bleeding, unexplained nose bleeds  Allergic/Immunologic recurrent infections, hives, swollen lymph nodes     Past Medical History:  She,  has a past medical history of Allergy, Anemia, Asthma, Carpal tunnel syndrome, Gallstone pancreatitis (2025), Kidney stones, PONV (postoperative nausea and vomiting), Seasonal allergies, and Vitamin B12 deficiency (07/30/2019).   Surgical History:   Past Surgical History:  Procedure Laterality Date   APPENDECTOMY     CHOLECYSTECTOMY N/A 01/14/2024   Procedure: LAPAROSCOPIC CHOLECYSTECTOMY WITH INTRAOPERATIVE CHOLANGIOGRAM;  Surgeon: Polly Cordella LABOR, MD;  Location[facility] OR;  Service: General;  Laterality: N/A;   HERNIA REPAIR     RIGID BRONCHOSCOPY N/A 05/18/2024   Procedure: BRONCHOSCOPY, RIGID;  Surgeon: Okey Burns, MD;  Location: [facility] OR;  Service: ENT;  Laterality: N/A;  DML bronchoscopy biopsy of the base of the tongue   TONSILLECTOMY Bilateral 05/18/2024   Procedure: TONSILLECTOMY;  Surgeon: Okey Burns, MD;  Location: [facility] OR;  Service: ENT;  Laterality: Bilateral;   TUBAL LIGATION       Social History:   reports that she quit smoking about 1 months ago. Her smoking use included cigarettes. She has never used smokeless tobacco. She  reports current alcohol use. She reports that she does not use drugs.   Family History:  Her family history includes Colon polyps in her mother; Diabetes in her maternal grandmother; Fibromyalgia in her mother; Heart disease in her mother. There is no history of Colon cancer, Esophageal cancer, Stomach cancer, or Rectal cancer.   Allergies Allergies  Allergen Reactions   Latex Itching, Rash and Other (See Comments)    Cause blisters also     Home Medications  Prior to Admission medications   Medication Sig Start Date End Date Taking? Authorizing Provider  acetaminophen  (TYLENOL ) 500 MG tablet Take 1 tablet (500 mg total) by mouth every 6 (six) hours. Take every 6 hrs x 2-3 days then take every 6 hrs as needed 05/18/24  Yes Okey Burns, MD  albuterol  (VENTOLIN  HFA) 108 (90 Base) MCG/ACT inhaler Inhale 1-2 puffs into the lungs every 6 (six) hours as needed for wheezing or shortness of breath.   Yes [provider]  clobetasol cream (TEMOVATE) 0.05 % Apply 1 Application topically 2 (two) times daily as needed (psoriasis). 11/08/23  Yes [provider]  Cyanocobalamin  (VITAMIN B 12 PO) Take 1 tablet by mouth daily.   Yes [provider]  Emollient (  EUCERIN) lotion Apply 1 Application topically as needed for dry skin.   Yes [provider]  estradiol  (ESTRACE  VAGINAL) 0.1 MG/GM vaginal cream Place 1 g vaginally 2 (two) times a week. Patient taking differently: Place 1 g vaginally See admin instructions. Twice a week if needed for dryness 10/18/22  Yes Wallace, Tiffany A, NP  fexofenadine (ALLEGRA) 180 MG tablet Take 180 mg by mouth daily.   Yes [provider]  fluticasone  (FLONASE ) 50 MCG/ACT nasal spray Place 1 spray into both nostrils daily as needed for allergies or rhinitis.   Yes [provider]  ibuprofen  (ADVIL ) 600 MG tablet Take 1 tablet (600 mg total) by mouth every 6 (six) hours. Please take every 6 hrs, and stagger this  medication 3 hrs apart from Tylenol  05/18/24  Yes Soldatova, Li[place], MD  oxyCODONE  (ROXICODONE ) 5 MG immediate release tablet Take 1 tablet (5 mg total) by mouth every 6 (six) hours as needed (severe pain after tonsillectomy not controlled with Tylenol  Motrin ). 05/18/24  Yes Soldatova, Liuba, MD  polyvinyl alcohol (LIQUIFILM TEARS) 1.4 % ophthalmic solution Place 1 drop into both eyes as needed for dry eyes. [employer] For dry eyes   Yes [provider]  predniSONE (DELTASONE) 10 MG tablet Take 2 tablets (20 mg total) by mouth daily with breakfast for 3 days. 05/20/24 05/23/24 Yes Tobie Eldora NOVAK, MD     Critical care time: N/A

## 2024-05-22 NOTE — ED Notes (Signed)
 Pt arrived 0600. Report given by Kurt PEAK. Pt alert and oriented, ambulatory, and stable upon assessment. Vitals signs WNL.

## 2024-05-22 NOTE — ED Notes (Signed)
 Pt wheeled to restroom via. Wheelchair. Pt stood and transferred without assistance

## 2024-05-22 NOTE — ED Notes (Signed)
 Back in bed and connected to monitor. Pt resting

## 2024-05-22 NOTE — ED Notes (Signed)
 CCMD called to add pt to monitoring.

## 2024-05-23 DIAGNOSIS — J0510 Acute epiglottitis without obstruction: Secondary | ICD-10-CM | POA: Diagnosis not present

## 2024-05-23 MED ORDER — SULFAMETHOXAZOLE-TRIMETHOPRIM 800-160 MG PO TABS: 1.0000 | ORAL_TABLET | Freq: Two times a day (BID) | ORAL | 0 refills | Status: AC

## 2024-05-23 MED ORDER — PREDNISONE 10 MG PO TABS: 20.0000 mg | ORAL_TABLET | Freq: Every day | ORAL | 0 refills | Status: AC

## 2024-05-23 MED ORDER — OXYCODONE HCL 5 MG PO TABS: 5.0000 mg | ORAL_TABLET | ORAL | 0 refills | Status: AC | PRN

## 2024-05-23 MED ORDER — PANTOPRAZOLE SODIUM 40 MG PO TBEC: 40.0000 mg | DELAYED_RELEASE_TABLET | Freq: Every day | ORAL | 0 refills | Status: AC

## 2024-05-23 NOTE — Progress Notes (Addendum)
 ENT PROGRESS NOTE   Subjective: Patient seen and examined at bedside. No issues overnight. She was able to tolerate PO yesterday. She feels better overall. No oral bleeding.   Objective: Vital signs in last 24 hours: Temp:  [97.7 F (36.5 C)-98.2 F (36.8 C)] 97.8 F (36.6 C) (10/01 0726) Pulse Rate:  [44-65] 63 (10/01 0726) Resp:  [16-25] 20 (10/01 0726) BP: (82-121)/(53-66) 82/59 (10/01 0726) SpO2:  [93 %-98 %] 97 % (10/01 0726) Weight:  [86.3 kg] 86.3 kg (09/30 1456)  Exam: General: Well-developed, well-nourished Respiratory Respiratory effort: Equal inspiration and expiration without stridor Cardiovascular Peripheral Vascular: Warm extremities with equal color/perfusion Eyes: No nystagmus with equal extraocular motion bilaterally Neuro/Psych/Balance: Patient oriented to person, place, and time; Appropriate mood and affect; Gait is intact with no imbalance; Cranial nerves I-XII are intact Head and Face Inspection: Normocephalic and atraumatic without mass or lesion Palpation: Facial skeleton intact without bony stepoffs Salivary Glands: No mass or tenderness  Facial Strength: Facial motility symmetric and full bilaterally ENT Pinna: External ear intact and fully developed External canal: Canal is patent with intact skin Tympanic Membrane: Clear and mobile External Nose: No scar or anatomic deformity Oral cavity/oropharynx: No erythema or exudate, no lesions present, post-op changes  Neck Neck and Trachea: Midline trachea without mass or lesion Thyroid: No mass or nodularity Lymphatics: No lymphadenopathy  Recent Labs    05/21/24 2350 05/22/24 0718  NA 140  --   K 3.8  --   CL 102  --   CO2 20*  --   GLUCOSE 88  --   BUN 23*  --   CREATININE 0.47 0.54  CALCIUM 9.5  --      Assessment/Plan: Odynophagia, trouble with swallowing after tonsillectomy and base of the tongue vallecular biopsy including lingual surface of epiglottis due to prominent lingual tonsils  and asymmetry on the left side   Scope exam yesterday with inflammation and eschar along the base of the tongue, airway is patent. She is feeling better now and able to tolerate PO.   - pain control with Tylenol  500 mg Q6hrs, Ibuprofen  600 mg Q6hrs standing and Oxy 5 mg Q4-6hrs PRN - provide pill crusher to take medications  - ok to d/c home on above pain control and Bactrim DS tomorrow with Prednisone taper (Pred 20 mg x 4 days, Pred 10 mg x 4 days then stop)     Allessandra Bernardi, MD 05/23/2024, 8:24 AM

## 2024-05-23 NOTE — Progress Notes (Signed)
 DISCHARGE NOTE HOME Becky King to be discharged Home per MD order. Discussed prescriptions and follow up appointments with the patient. Prescriptions given to patient; medication list explained in detail. Patient verbalized understanding.  Skin clean, dry and intact without evidence of skin break down, no evidence of skin tears noted. IV catheter discontinued intact. Site without signs and symptoms of complications. Dressing and pressure applied. Pt denies pain at the site currently. No complaints noted.  Patient free of lines, drains, and wounds.   An After Visit Summary (AVS) was printed and given to the patient. Taken to Discharge lounge to wait for ride. Patient escorted via wheelchair, and discharged home via private auto.  Peyton SHAUNNA Pepper, RN

## 2024-05-25 ENCOUNTER — Telehealth (INDEPENDENT_AMBULATORY_CARE_PROVIDER_SITE_OTHER): Payer: Self-pay | Admitting: Otolaryngology

## 2024-05-25 LAB — SURGICAL PATHOLOGY

## 2024-05-25 NOTE — Telephone Encounter (Signed)
 Patient called in and stated that she has been discharged from the hospital and wanted to know when she needed to come in to see Dr. Soldatova for a follow-up for the complications.  Was also wondering about biopsy results.  Please advise.  [phone]

## 2024-05-25 NOTE — Discharge Summary (Signed)
 Physician Discharge Summary  Becky King FMW:[id number] DOB: February 27, 1971 DOA: 05/21/2024  PCP: Pcp, No  Admit date: 05/21/2024 Discharge date: 05/23/2024  Admitted From: Home Disposition: Home  recommendations for Outpatient Follow-up:  Follow up with PCP in 1-2 weeks Please obtain BMP/CBC in one week Please follow up with ENT  Home Health: No Equipment/Devices none  Discharge Condition: Stable CODE STATUS: Full Diet recommendation: Cardiac soft diet Brief/Interim Summary:   53 y.o. female with medical history significant of s/p tonisllectomy on 9/26 per ENT p/w dysphagia and CT imaging c/f epiglottitis.   The patient reported having difficulty swallowing following a tonsillectomy performed on Friday. The patient described the sensation of drowning when attempting to drink liquids or consume soups, as if the liquid was being absorbed slowly like a sponge. The patient contacted the on-call medical team on Saturday, who initially reassured them that the symptoms were normal. However, upon calling again on Sunday, the patient was prescribed prednisone to help with swallowing difficulties. Despite taking three doses of the medication, the patient reported that the symptoms did not improve. The patient had to crush the prednisone tablets and dissolve them in water to ingest them, which may have affected the dosage received. On Monday, the patient attempted to contact the surgical team four times without receiving a response; thus, she presented to the ED.   In the ED, pt AFVSS. Labs notable for WBC 13 (pt on oral steroids per OP ENT). OSH ED to ED transfer for urgent ENT evaluation and medicine PCU requested; however, CCM consulted given c/f impending airway compromise and need for ICU.   Discharge Diagnoses:  Principal Problem:   Epiglottitis    Odynophagia s/p tonsillectomy Possible epiglottitis-she was admitted to progressive unit treated with Unasyn steroids Decadron  and PPI.   Patient was seen in consultation by ENT and PCCM.  She was discharged home on oral Bactrim for 2 weeks with prednisone.  She was able to swallow food without significant pain.  Follow-up surgical pathology with ENT.    Estimated body mass index is 33.7 kg/m as calculated from the following:   Height as of this encounter: 5' 3 (1.6 m).   Weight as of this encounter: 86.3 kg.  Discharge Instructions  Discharge Instructions     Diet - low sodium heart healthy   Complete by: As directed    Increase activity slowly   Complete by: As directed       Allergies as of 05/23/2024       Reactions   Latex Itching, Rash, Other (See Comments)   Cause blisters also        Medication List     TAKE these medications    acetaminophen  500 MG tablet Commonly known as: TYLENOL  Take 1 tablet (500 mg total) by mouth every 6 (six) hours. Take every 6 hrs x 2-3 days then take every 6 hrs as needed   albuterol  108 (90 Base) MCG/ACT inhaler Commonly known as: VENTOLIN  HFA Inhale 1-2 puffs into the lungs every 6 (six) hours as needed for wheezing or shortness of breath.   artificial tears ophthalmic solution Place 1 drop into both eyes as needed for dry eyes. [employer] For dry eyes   clobetasol cream 0.05 % Commonly known as: TEMOVATE Apply 1 Application topically 2 (two) times daily as needed (psoriasis).   estradiol  0.1 MG/GM vaginal cream Commonly known as: ESTRACE  VAGINAL Place 1 g vaginally 2 (two) times a week. What changed:  when to take this  additional instructions   eucerin lotion Apply 1 Application topically as needed for dry skin.   fexofenadine 180 MG tablet Commonly known as: ALLEGRA Take 180 mg by mouth daily.   fluticasone  50 MCG/ACT nasal spray Commonly known as: FLONASE  Place 1 spray into both nostrils daily as needed for allergies or rhinitis.   ibuprofen  600 MG tablet Commonly known as: ADVIL  Take 1 tablet (600 mg total) by mouth every 6 (six) hours.  Please take every 6 hrs, and stagger this medication 3 hrs apart from Tylenol    oxyCODONE  5 MG immediate release tablet Commonly known as: Oxy IR/ROXICODONE  Take 1 tablet (5 mg total) by mouth every 4 (four) hours as needed for severe pain (pain score 7-10). What changed:  when to take this reasons to take this   pantoprazole 40 MG tablet Commonly known as: Protonix Take 1 tablet (40 mg total) by mouth daily.   predniSONE 10 MG tablet Commonly known as: DELTASONE Take 2 tablets (20 mg total) by mouth daily for 4 days. What changed: when to take this   sulfamethoxazole-trimethoprim 800-160 MG tablet Commonly known as: Bactrim DS Take 1 tablet by mouth 2 (two) times daily.   VITAMIN B 12 PO Take 1 tablet by mouth daily.        Follow-up Information     Okey Burns, MD Follow up.   Specialty: Otolaryngology Contact information: [address], [address] [place] [address] [phone]                Allergies  Allergen Reactions   Latex Itching, Rash and Other (See Comments)    Cause blisters also    Consultations: ENT and PCCM   Procedures/Studies: CT Soft Tissue Neck W Contrast Result Date: 05/22/2024 CLINICAL DATA:  Initial evaluation for difficulty swallowing, recent tonsillectomy. EXAM: CT NECK WITH CONTRAST TECHNIQUE: Multidetector CT imaging of the neck was performed using the standard protocol following the bolus administration of intravenous contrast. RADIATION DOSE REDUCTION: This exam was performed according to the departmental dose-optimization program which includes automated exposure control, adjustment of the mA and/or kV according to patient size and/or use of iterative reconstruction technique. CONTRAST:  75mL OMNIPAQUE  IOHEXOL  300 MG/ML  SOLN COMPARISON:  None Available. FINDINGS: Pharynx and larynx: Oral cavity within normal limits. Postoperative changes from recent bilateral tonsillectomy. Small volume fluid and gas within the  resection cavities. No loculated collections. Mucosal edema within the adjacent oropharyngeal mucosa with hazy stranding within the adjacent parapharyngeal fat. Trace layering retropharyngeal effusion without retropharyngeal collection. Epiglottis is thickened and edematous as are the aryepiglottic folds. Vallecula effaced by the lingual tonsils. While these findings may in part be postoperative in nature, possible changes of supraglottitis could also be contributory. There is secondary narrowing of the supraglottic airway at the level of the epiglottis, measuring 2 mm in AP diameter at its most narrow point (series 5, image 49). There is an apparent 3-4 mm polypoid lesion extending laterally from the right aryepiglottic fold, protruding into the piriform sinus (series 2, image 52). Findings indeterminate, but could reflect a small polyp. Glottis within normal limits inferiorly. Subglottic airway clear. Salivary glands: Salivary glands including the parotid and submandibular glands are within normal limits. Thyroid: Normal. Lymph nodes: Mildly prominent 1.2 cm right level 2 lymph node, nonspecific, but could be reactive. No other enlarged or pathologic adenopathy within the neck. Vascular: Normal intravascular enhancement seen within the neck. Mild atheromatous change noted about the carotid bifurcations. Limited intracranial: Unremarkable. Visualized orbits: Unremarkable.  Mastoids and visualized paranasal sinuses: Mild mucosal thickening present about the ethmoidal air cells and left maxillary sinus. Small chronic appearing bilateral mastoid effusions, of doubtful significance. Middle ear cavities are clear. Skeleton: No worrisome osseous lesions.  Poor dentition noted. Upper chest: Scattered patchy ground-glass opacity within the partially visualized right upper lobe, likely mild infection/pneumonitis (series 3, image 96). Other: None. IMPRESSION: 1. Postoperative changes from recent bilateral tonsillectomy. Small  volume fluid and gas within the resection cavities, but with no loculated collections or other postoperative complication. 2. Edema about the oropharyngeal mucosa with thickening of the epiglottis and aryepiglottic folds. While these findings may in part be postoperative in nature/intubation related, possible superimposed/concomitant changes of acute supraglottitis could be considered in the correct clinical setting. Secondary narrowing of the supraglottic airway which measures 2 mm in AP diameter at its most narrow point. Close clinical monitoring recommended, as this patient is potentially at risk for developing airway compromise. 3. Scattered patchy ground-glass opacity within the partially visualized right upper lobe, likely mild infection/pneumonitis. 4. 3-4 mm polypoid lesion extending laterally from the right aryepiglottic fold, protruding into the piriform sinus. Finding is indeterminate, but could reflect a small polyp. Correlation with direct inspection recommended. Results were called by telephone at the time of interpretation on 05/22/2024 at 2:13 am to provider [facility] , who verbally acknowledged these results. Electronically Signed   By: Morene Hoard M.D.   On: 05/22/2024 02:16   (Echo, Carotid, EGD, Colonoscopy, ERCP)    Subjective:  Patient resting in bed able to tolerate a diet anxious to go home she had bradycardia without any symptoms she was not on any nodal blocking agents she denied any chest pain dizziness shortness of breath palpitations etc. Discharge Exam: Vitals:   05/23/24 0301 05/23/24 0726  BP: (!) 121/53 (!) 82/59  Pulse: (!) 44 63  Resp: 20 20  Temp: 98.2 F (36.8 C) 97.8 F (36.6 C)  SpO2: 94% 97%   Vitals:   05/22/24 1945 05/22/24 2348 05/23/24 0301 05/23/24 0726  BP: 98/66 (!) 115/59 (!) 121/53 (!) 82/59  Pulse: 65 (!) 51 (!) 44 63  Resp: (!) 25 16 20 20   Temp: 97.9 F (36.6 C) 97.7 F (36.5 C) 98.2 F (36.8 C) 97.8 F (36.6 C)   TempSrc: Oral Oral Oral Oral  SpO2: 93% 98% 94% 97%  Weight:      Height:        General: Pt is alert, awake, not in acute distress Cardiovascular: RRR, S1/S2 +, no rubs, no gallops Respiratory: CTA bilaterally, no wheezing, no rhonchi Abdominal: Soft, NT, ND, bowel sounds + Extremities: no edema, no cyanosis    The results of significant diagnostics from this hospitalization (including imaging, microbiology, ancillary and laboratory) are listed below for reference.     Microbiology: No results found for this or any previous visit (from the past 240 hours).   Labs: BNP (last 3 results) No results for input(s): BNP in the last 8760 hours. Basic Metabolic Panel: Recent Labs  Lab 05/21/24 2350 05/22/24 0718  NA 140  --   K 3.8  --   CL 102  --   CO2 20*  --   GLUCOSE 88  --   BUN 23*  --   CREATININE 0.47 0.54  CALCIUM 9.5  --    Liver Function Tests: No results for input(s): AST, ALT, ALKPHOS, BILITOT, PROT, ALBUMIN in the last 168 hours. No results for input(s): LIPASE, AMYLASE in the last 168 hours.  No results for input(s): AMMONIA in the last 168 hours. CBC: Recent Labs  Lab 05/21/24 2350 05/22/24 0718  WBC 13.2* 10.6*  NEUTROABS 10.6*  --   HGB 14.0 13.7  HCT 41.3 40.7  MCV 89.0 88.7  PLT 322 333   Cardiac Enzymes: No results for input(s): CKTOTAL, CKMB, CKMBINDEX, TROPONINI in the last 168 hours. BNP: Invalid input(s): POCBNP CBG: No results for input(s): GLUCAP in the last 168 hours. D-Dimer No results for input(s): DDIMER in the last 72 hours. Hgb A1c No results for input(s): HGBA1C in the last 72 hours. Lipid Profile No results for input(s): CHOL, HDL, LDLCALC, TRIG, CHOLHDL, LDLDIRECT in the last 72 hours. Thyroid function studies No results for input(s): TSH, T4TOTAL, T3FREE, THYROIDAB in the last 72 hours.  Invalid input(s): FREET3 Anemia work up No results for input(s):  VITAMINB12, FOLATE, FERRITIN, TIBC, IRON, RETICCTPCT in the last 72 hours. Urinalysis    Component Value Date/Time   COLORURINE YELLOW 01/12/2024 1732   APPEARANCEUR CLEAR 01/12/2024 1732   LABSPEC 1.024 01/12/2024 1732   PHURINE 7.0 01/12/2024 1732   GLUCOSEU NEGATIVE 01/12/2024 1732   HGBUR NEGATIVE 01/12/2024 1732   BILIRUBINUR NEGATIVE 01/12/2024 1732   KETONESUR NEGATIVE 01/12/2024 1732   PROTEINUR NEGATIVE 01/12/2024 1732   NITRITE NEGATIVE 01/12/2024 1732   LEUKOCYTESUR NEGATIVE 01/12/2024 1732   Sepsis Labs Recent Labs  Lab 05/21/24 2350 05/22/24 0718  WBC 13.2* 10.6*   Microbiology No results found for this or any previous visit (from the past 240 hours).   Time coordinating discharge: 49 MIN  SIGNED:   Almarie KANDICE Hoots, MD  [facility] 05/25/2024, 6:09 PM

## 2024-05-28 ENCOUNTER — Ambulatory Visit (INDEPENDENT_AMBULATORY_CARE_PROVIDER_SITE_OTHER): Payer: Self-pay

## 2024-05-28 NOTE — Telephone Encounter (Signed)
 Spoke to patient regarding results.

## 2024-05-31 ENCOUNTER — Ambulatory Visit (INDEPENDENT_AMBULATORY_CARE_PROVIDER_SITE_OTHER): Admitting: Otolaryngology

## 2024-05-31 ENCOUNTER — Encounter (INDEPENDENT_AMBULATORY_CARE_PROVIDER_SITE_OTHER): Payer: Self-pay | Admitting: Otolaryngology

## 2024-05-31 VITALS — BP 107/71 | HR 53

## 2024-05-31 DIAGNOSIS — R070 Pain in throat: Secondary | ICD-10-CM

## 2024-05-31 DIAGNOSIS — J351 Hypertrophy of tonsils: Secondary | ICD-10-CM

## 2024-05-31 DIAGNOSIS — J039 Acute tonsillitis, unspecified: Secondary | ICD-10-CM

## 2024-05-31 MED ORDER — FLUTICASONE PROPIONATE 50 MCG/ACT NA SUSP: 2.0000 | Freq: Every day | NASAL | 6 refills | Status: DC

## 2024-05-31 MED ORDER — FEXOFENADINE HCL 180 MG PO TABS: 180.0000 mg | ORAL_TABLET | Freq: Every day | ORAL | 6 refills | Status: AC

## 2024-05-31 NOTE — Progress Notes (Signed)
 ENT CONSULT:  Reason for Consult: abnormal soft tissue vallecula c-spine scan    HPI: Discussed the use of AI scribe software for clinical note transcription with the Becky King, who gave verbal consent to proceed.  History of Present Illness Becky King is a 53 year old female who presents with CT c-spine findings that warrant visualization with scope exam.   She initially sought medical attention after falling down stairs, resulting in shoulder pain. During the evaluation, a CT scan of cervical spine was performed, revealing prominent soft tissue along vallecula. No sensation of throat swelling or neck lump has been experienced in the past. No history of hemoptysis, cancer, stroke, or myocardial infarction. No dyspnea or dysphagia or odynophagia.   She has a history of gallbladder removal, after which her previous symptoms of heartburn resolved.  She quit smoking a couple of months ago.  She experiences dry skin, including psoriasis, affecting her ears and scalp. She uses a cream to manage these symptoms, noting that if she does not apply it daily, the condition worsens.   Becky King is a 53 year old female who presents with persistent throat discomfort and swallowing difficulties following recent surgery.  She experiences intermittent throat pain described as a sensation of 'something in my throat,' possibly a scab. Swallowing is possible but sometimes feels like 'some of it comes back up.'  She underwent a biopsy due to prominent soft tissue in the posterior tongue. The biopsy results were negative. She is currently taking Bactrim, which was prescribed for a thirty-day course.  Pain management includes alternating oxycodone , Tylenol , and ibuprofen . Pain relief lasts about two hours, leaving discomfort for the last hour before the next dose.  She is also taking Protonix and Allegra, and mentions needing a refill for Flonase . She inquires about whether her adenoids were  removed during surgery, which they were not.  She describes experiencing pain across her throat and congestion, which she attributes to possibly having an upper respiratory infection around the time of her surgery.   Records Reviewed:  ED Note 03/23/24 Becky King is a 53 year old female with a history of anemia, asthma, kidney stones who is presenting today with complaint of ongoing headache, neck pain, facial pain after falling down multiple bleacher steps 9 days ago. She reports she did hit her face on the way down but did not have any loss of consciousness. She did follow-up in the emergency room where they imaged her right shoulder which was hurting. At that time no fractures were found. Subsequently she followed up with orthopedics and they told her that there was actually a fracture and she was currently in a sling. She and her family have just been very concerned because she has been very irritable, sleepy and fatigued, headaches every day and just not herself. She denies any vision changes. She has no throat pain, difficulty swallowing or voice changes. No chest pain or shortness of breath and no numbness or tingling going down her arms or legs.     Past Medical History:  Diagnosis Date   Allergy    Anemia    Asthma    HAS MDI   Carpal tunnel syndrome    CARPAL TUNNEL WITH SURGERY    Gallstone pancreatitis 2025   Kidney stones    PONV (postoperative nausea and vomiting)    Seasonal allergies    Vitamin B12 deficiency 07/30/2019    Past Surgical History:  Procedure Laterality Date   APPENDECTOMY     CHOLECYSTECTOMY N/A  01/14/2024   Procedure: LAPAROSCOPIC CHOLECYSTECTOMY WITH INTRAOPERATIVE CHOLANGIOGRAM;  Surgeon: Polly Cordella LABOR, MD;  Location: [facility] OR;  Service: General;  Laterality: N/A;   HERNIA REPAIR     RIGID BRONCHOSCOPY N/A 05/18/2024   Procedure: BRONCHOSCOPY, RIGID;  Surgeon: Okey Burns, MD;  Location: [facility] OR;  Service: ENT;  Laterality: N/A;  DML bronchoscopy biopsy  of the base of the tongue   TONSILLECTOMY Bilateral 05/18/2024   Procedure: TONSILLECTOMY;  Surgeon: Okey Burns, MD;  Location: [facility] OR;  Service: ENT;  Laterality: Bilateral;   TUBAL LIGATION      Family History  Problem Relation Age of Onset   Fibromyalgia Mother    Heart disease Mother    Colon polyps Mother    Diabetes Maternal Grandmother    Colon cancer Neg Hx    Esophageal cancer Neg Hx    Stomach cancer Neg Hx    Rectal cancer Neg Hx     Social History:  reports that she quit smoking about 2 months ago. Her smoking use included cigarettes. She has never used smokeless tobacco. She reports current alcohol use. She reports that she does not use drugs.  Allergies:  Allergies  Allergen Reactions   Latex Itching, Rash and Other (See Comments)    Cause blisters also    Medications: I have reviewed the Becky King's current medications.  The PMH, PSH, Medications, Allergies, and SH were reviewed and updated.  ROS: Constitutional: Negative for fever, weight loss and weight gain. Cardiovascular: Negative for chest pain and dyspnea on exertion. Respiratory: Is not experiencing shortness of breath at rest. Gastrointestinal: Negative for nausea and vomiting. Neurological: Negative for headaches. Psychiatric: The Becky King is not nervous/anxious  Blood pressure 107/71, pulse (!) 53, last menstrual period 01/12/2018, SpO2 98%. There is no height or weight on file to calculate BMI.  PHYSICAL EXAM:  Exam: General: Well-developed, well-nourished Communication and Voice: Clear pitch and clarity Respiratory Respiratory effort: Equal inspiration and expiration without stridor Cardiovascular Peripheral Vascular: Warm extremities with equal color/perfusion Eyes: No nystagmus with equal extraocular motion bilaterally Neuro/Psych/Balance: Becky King oriented to person, place, and time; Appropriate mood and affect; Gait is intact with no imbalance; Cranial nerves I-XII are intact Head  and Face Inspection: Normocephalic and atraumatic without mass or lesion Palpation: Facial skeleton intact without bony stepoffs Salivary Glands: No mass or tenderness Facial Strength: Facial motility symmetric and full bilaterally ENT Pinna: External ear intact and fully developed External canal: Canal is patent with intact skin Tympanic Membrane: Clear and mobile External Nose: No scar or anatomic deformity Internal Nose: Septum is deviated to the left. No polyp, or purulence. Mucosal edema and erythema present.  Bilateral inferior turbinate hypertrophy.  Lips, Teeth, and gums: Mucosa and teeth intact and viable TMJ: No pain to palpation with full mobility Oral cavity/oropharynx: No erythema or exudate, no lesions present surgically absent tonsils healing as expected  Base of the tongue with prominent lingual tonsils and healing area at the epiglottis where bx was taken Nasopharynx: No mass or lesion with intact mucosa Hypopharynx: Intact mucosa without pooling of secretions Larynx Glottic: Full true vocal cord mobility without lesion or mass Supraglottic: Normal appearing epiglottis and AE folds Interarytenoid Space: Moderate pachydermia&edema Subglottic Space: Patent without lesion or edema Neck Neck and Trachea: Midline trachea without mass or lesion Thyroid: No mass or nodularity Lymphatics: No lymphadenopathy  Procedure: Preoperative diagnosis: asymmetric appearance of base of the tongue s/p biopsy with pain   Postoperative diagnosis:   minimal inflammation along lingual tonsils  Procedure: Flexible fiberoptic laryngoscopy  Surgeon: Nateisha Moyd, MD  Anesthesia: Topical lidocaine  and Afrin Complications: None Condition is stable throughout exam  Indications and consent:  The Becky King presents to the clinic with above symptoms. Indirect laryngoscopy view was incomplete. Thus it was recommended that they undergo a flexible fiberoptic laryngoscopy. All of the risks,  benefits, and potential complications were reviewed with the Becky King preoperatively and verbal informed consent was obtained.  Procedure: The Becky King was seated upright in the clinic. Topical lidocaine  and Afrin were applied to the nasal cavity. After adequate anesthesia had occurred, I then proceeded to pass the flexible telescope into the nasal cavity. The nasal cavity was patent without rhinorrhea or polyp. The nasopharynx was also patent without mass or lesion. The base of tongue was visualized and was normal. There were no signs of pooling of secretions in the piriform sinuses. The true vocal folds were mobile bilaterally. There were no signs of glottic or supraglottic mucosal lesion or mass. There was moderate interarytenoid pachydermia and post cricoid edema. The telescope was then slowly withdrawn and the Becky King tolerated the procedure throughout.    Studies Reviewed: CT max face 03/23/24 CLINICAL HISTORY: Facial trauma, blunt. Pt advises fall approx 9 days ago, seen for same at [facility]. States she did not have head CT but has HA, neck pain since.   FINDINGS:   FACIAL BONES: No acute facial fracture. No mandibular dislocation. No suspicious bone lesion.   ORBITS: Globes are intact. No acute traumatic injury. No inflammatory change.   SINUSES AND MASTOIDS: No acute abnormality.   SOFT TISSUES: Mild right frontal scalp soft tissue swelling. Enlarged palatine and lingual tonsils.   IMPRESSION: 1. No acute facial fracture. 2. Mild right frontal scalp soft tissue swelling.  CT c-spine CT HEAD FINDINGS   Brain: No evidence of acute infarction, hemorrhage, hydrocephalus, extra-axial collection or mass lesion/mass effect.   Vascular: No hyperdense vessel or unexpected calcification.   Skull: Normal. Negative for fracture or focal lesion.   Sinuses/Orbits: No acute finding.   Other: Small forehead scalp hematoma.   CT CERVICAL SPINE FINDINGS   Alignment: Straightening of the  cervical spine. Facet alignment is normal.   Skull base and vertebrae: No acute fracture. No primary bone lesion or focal pathologic process.   Soft tissues and spinal canal: No prevertebral fluid or swelling. No visible canal hematoma.   Disc levels: Mild disc space narrowing and degenerative change C4-C5 and C5-C6.   Upper chest: Lung apices are clear   Other: Abnormal appearing soft tissue thickening at the vallecula and tonsils   IMPRESSION: 1. Negative non contrasted CT appearance of the brain. 2. Straightening of the cervical spine with mild degenerative change. No acute osseous abnormality. 3. Abnormal appearing soft tissue thickening at the vallecula and tonsils. Recommend correlation with direct visualization/ENT consultation.  Assessment/Plan: Encounter Diagnoses  Name Primary?   Tonsillar hypertrophy Yes   Lingual tonsillitis    Throat discomfort     Assessment and Plan Assessment & Plan Enlarged tonsils with airway crowding and asymmetric left oropharyngeal tissue with left palatine and lingual tonsillar enlargement much more pronounced relative to the right.  Neoplasm cannot be ruled out. We discussed tonsillectomy and direct laryngoscopy and biopsy of the left base of the tongue, discussed risks and benefits and she would like to proceed.  - Schedule tonsillectomy to alleviate obstruction and examine asymmetric tissue. - Perform direct laryngoscopy bronchoscopy and biopsy of asymmetric tissue along the left base of the tongue vallecula -  Coordinate with surgery scheduler for procedure date.  Update 05/31/24  Postoperative oropharyngeal pain and healing following lingual tonsil biopsy for lingual tonsil hypertrophy asymmetry on the left and bilateral tonsillectomy for palatine tonsillar hypertrophy Biopsy negative for malignancy. Lingual tonsil hypertrophy attributed to chronic inflammation. Scope exam improved relative to exam in the hospital  - Continue  Bactrim for 14 days - continue PPI and medications for post-nasal drip  Dysphagia associated with postoperative recovery Dysphagia likely due to postoperative changes and healing. - Monitor swallowing difficulties as healing progresses.  Chronic nasal congestion and post-nasal drainage - Prescribed Flonase  for nasal congestion. - Continued Allegra for allergy management.  Gastroesophageal reflux disease (GERD) GERD may contribute to chronic inflammation and postoperative symptoms. - Continue Protonix as prescribed. - diet and lifestyle changes   RTC 3-4 weeks  Thank you for allowing me to participate in the care of this Becky King. Please do not hesitate to contact me with any questions or concerns.   Elena Larry, MD Otolaryngology [facility] Phone: [phone] Fax: [phone]    05/31/2024, 3:33 PM

## 2024-06-04 ENCOUNTER — Other Ambulatory Visit (INDEPENDENT_AMBULATORY_CARE_PROVIDER_SITE_OTHER): Payer: Self-pay | Admitting: Otolaryngology

## 2024-06-06 ENCOUNTER — Telehealth (INDEPENDENT_AMBULATORY_CARE_PROVIDER_SITE_OTHER): Payer: Self-pay

## 2024-06-06 ENCOUNTER — Other Ambulatory Visit (INDEPENDENT_AMBULATORY_CARE_PROVIDER_SITE_OTHER): Payer: Self-pay | Admitting: Otolaryngology

## 2024-06-06 MED ORDER — IBUPROFEN 600 MG PO TABS: 600.0000 mg | ORAL_TABLET | Freq: Four times a day (QID) | ORAL | 0 refills | Status: AC

## 2024-06-06 MED ORDER — ACETAMINOPHEN 500 MG PO TABS
500.0000 mg | ORAL_TABLET | Freq: Four times a day (QID) | ORAL | 0 refills | Status: AC
Start: 2024-06-06 — End: ?

## 2024-06-06 NOTE — Telephone Encounter (Signed)
 Patient wants to know if she can get a refill on her ibuprofen  from surgery. Please advise.

## 2024-06-08 ENCOUNTER — Ambulatory Visit (INDEPENDENT_AMBULATORY_CARE_PROVIDER_SITE_OTHER): Admitting: Otolaryngology

## 2024-06-11 ENCOUNTER — Ambulatory Visit (INDEPENDENT_AMBULATORY_CARE_PROVIDER_SITE_OTHER): Admitting: Otolaryngology

## 2024-06-18 ENCOUNTER — Telehealth (INDEPENDENT_AMBULATORY_CARE_PROVIDER_SITE_OTHER): Payer: Self-pay

## 2024-06-18 NOTE — Telephone Encounter (Signed)
 Pt had surgery with Soldatova she is still having some issues she has an APPT with you next week she has jury duty this Thursday and is still spitting up a lot of tissue and having issues the court said they needed a letter from you excusing her from jury duty. Let me know if you want me to write one up.

## 2024-06-18 NOTE — Telephone Encounter (Signed)
 Pt called leaving a VM asking for a note to get out of jury duty she was seen by soldatova but has a follow up appt with masciello I had asked masciello about a note masciello stated she did not feel comfortable writing a note with out physically seeing the Pt first. I called Pt to let them know she understood but was really upset I let her know that she could talk to her PCP about a note she stated that PCP said that because she had surgery here we had to give the note but Dr Greggory did not feel comfortable with it until she sees her face to face.

## 2024-06-25 ENCOUNTER — Ambulatory Visit (INDEPENDENT_AMBULATORY_CARE_PROVIDER_SITE_OTHER)

## 2024-06-25 ENCOUNTER — Ambulatory Visit (INDEPENDENT_AMBULATORY_CARE_PROVIDER_SITE_OTHER): Admitting: Otolaryngology

## 2024-06-25 ENCOUNTER — Encounter (INDEPENDENT_AMBULATORY_CARE_PROVIDER_SITE_OTHER): Payer: Self-pay

## 2024-06-25 VITALS — BP 138/86 | HR 69

## 2024-06-25 DIAGNOSIS — R070 Pain in throat: Secondary | ICD-10-CM | POA: Diagnosis not present

## 2024-06-25 DIAGNOSIS — R1312 Dysphagia, oropharyngeal phase: Secondary | ICD-10-CM

## 2024-06-25 DIAGNOSIS — J351 Hypertrophy of tonsils: Secondary | ICD-10-CM

## 2024-06-25 DIAGNOSIS — R09A2 Foreign body sensation, throat: Secondary | ICD-10-CM | POA: Diagnosis not present

## 2024-06-25 NOTE — Progress Notes (Signed)
 Dear Dr. Rexford ref. provider found, Here is my assessment for our mutual patient, Becky King. Thank you for allowing me the opportunity to care for your patient. Please do not hesitate to contact me should you have any other questions. Sincerely, Dr. Hadassah Parody  Otolaryngology Clinic Note Referring provider: Dr. Rexford ref. provider found HPI:    (06/25/2024) Becky King is a 53 y.o. female presenting for evaluation of lingual tonsillar hypertrophy.   Prior pt of Dr. Soldatova.   Initially taken to OR on 05/18/24 by Liuba Soldatova for tonsillectomy and DL bx of epiglottis and tongue base. She then came to ED on 05/22/24 with concern for epiglottitis after biopsies. No OR at that time. Biopsies in OR were negative but did state that epiglottis biopsy showed some cytologic atypia and acute inflammation, overall thought to represent reactive hyperplasia.    Discussed the use of AI scribe software for clinical note transcription with the patient, who gave verbal consent to proceed.  History of Present Illness Today seeing me for first time. Reports she still has pain at times and feels like something stuck in her throat.   Dysphagia - Swallowing difficulties following recent tonsil surgery and biopsy - Difficulty swallowing liquids, with regurgitation through the nose - Throat described as a 'sponge' that fills up and causes liquids to come back out - Solid foods do not cause issues - No swallow test performed since surgery - Initially coughed up tissue postoperatively, now only coughs with plain liquid  Perceived dysarthria  - Persistent change in voice since surgery - when asked more about this sound more like dysarthria than dysphonia  - Voice described as slurred and 'drunk'-sounding - Feels like this impairs her ability to communicate, especially over the phone  Oropharyngeal pain and sensations - Occasional burning pain in the back of the throat - Frequent spitting,  currently feels dry - Persistent sensation of something stuck in the throat - no ear pain and pain is not severe   Pain management - Alternates ibuprofen  and Tylenol  for pain control   Independent Review of Additional Tests or Records:  Surg path 05/18/24 reviewed and discussed above  Op note Elena Larry 05/18/24 reviewed and discussed above.   CT C spine 03/23/24 independently reviewed and shows soft tissue mass at base of tongue/epiglottis    PMH/Meds/All/SocHx/FamHx/ROS:   Past Medical History:  Diagnosis Date   Allergy    Anemia    Asthma    HAS MDI   Carpal tunnel syndrome    CARPAL TUNNEL WITH SURGERY    Gallstone pancreatitis 2025   Kidney stones    PONV (postoperative nausea and vomiting)    Seasonal allergies    Vitamin B12 deficiency 07/30/2019     Past Surgical History:  Procedure Laterality Date   APPENDECTOMY     CHOLECYSTECTOMY N/A 01/14/2024   Procedure: LAPAROSCOPIC CHOLECYSTECTOMY WITH INTRAOPERATIVE CHOLANGIOGRAM;  Surgeon: Polly Cordella LABOR, MD;  Location: [facility] OR;  Service: General;  Laterality: N/A;   HERNIA REPAIR     RIGID BRONCHOSCOPY N/A 05/18/2024   Procedure: BRONCHOSCOPY, RIGID;  Surgeon: Larry Elena, MD;  Location: [facility] OR;  Service: ENT;  Laterality: N/A;  DML bronchoscopy biopsy of the base of the tongue   TONSILLECTOMY Bilateral 05/18/2024   Procedure: TONSILLECTOMY;  Surgeon: Larry Elena, MD;  Location: [facility] OR;  Service: ENT;  Laterality: Bilateral;   TUBAL LIGATION      Family History  Problem Relation Age of Onset   Fibromyalgia Mother  Heart disease Mother    Colon polyps Mother    Diabetes Maternal Grandmother    Colon cancer Neg Hx    Esophageal cancer Neg Hx    Stomach cancer Neg Hx    Rectal cancer Neg Hx      Social Connections: Moderately Isolated (01/13/2024)   Social Connection and Isolation Panel    Frequency of Communication with Friends and Family: More than three times a week    Frequency of Social  Gatherings with Friends and Family: Three times a week    Attends Religious Services: Never    Active Member of Clubs or Organizations: No    Attends Engineer, Structural: More than 4 times per year    Marital Status: Never married     Current Outpatient Medications  Medication Instructions   acetaminophen  (TYLENOL ) 500 mg, Oral, Every 6 hours, Take every 6 hrs x 2-3 days then take every 6 hrs as needed   albuterol  (VENTOLIN  HFA) 108 (90 Base) MCG/ACT inhaler 1-2 puffs, Every 6 hours PRN   clobetasol cream (TEMOVATE) 0.05 % 1 Application, 2 times daily PRN   Cyanocobalamin  (VITAMIN B 12 PO) 1 tablet, Daily   Emollient (EUCERIN) lotion 1 Application, As needed   estradiol  (ESTRACE  VAGINAL) 1 g, Vaginal, 2 times weekly   fexofenadine (ALLEGRA) 180 mg, Oral, Daily   fluticasone  (FLONASE ) 50 MCG/ACT nasal spray 1 spray, Daily PRN   fluticasone  (FLONASE ) 50 MCG/ACT nasal spray 2 sprays, Each Nare, Daily   ibuprofen  (ADVIL ) 600 mg, Oral, Every 6 hours, Please take every 6 hrs, and stagger this medication 3 hrs apart from Tylenol    ibuprofen  (ADVIL ) 600 mg, Oral, Every 6 hours, Please take every 6 hrs, and stagger this medication 3 hrs apart from Tylenol    oxyCODONE  (OXY IR/ROXICODONE ) 5 mg, Oral, Every 4 hours PRN   pantoprazole (PROTONIX) 40 mg, Oral, Daily   polyvinyl alcohol (LIQUIFILM TEARS) 1.4 % ophthalmic solution 1 drop, As needed   sulfamethoxazole-trimethoprim (BACTRIM DS) 800-160 MG tablet 1 tablet, Oral, 2 times daily     Physical Exam:   BP 138/86   Pulse 69   LMP 01/12/2018   SpO2 98%   Salient findings:  CN II-XII intact No dysarthria today  No lesions of oral cavity/oropharynx No obviously palpable neck masses/lymphadenopathy/thyromegaly No respiratory distress or stridor TFL was indicated to better evaluate the proximal airway, given the patient's history and exam findings, and is detailed below.   Seprately Identifiable Procedures:  Prior to initiating  any procedures, risks/benefits/alternatives were explained to the patient and verbal consent obtained.  Procedure Note (06/25/2024) Pre-procedure diagnosis:  Lingual tonsillar hypertrophy, globus sensation, throat pain  Post-procedure diagnosis: Same Procedure: Transnasal Fiberoptic Laryngoscopy, CPT 31575 - Mod 25 Indication: lingual tonsillar hypertrophy, globus sensation, throat pain  Complications: None apparent EBL: 0 mL  The procedure was undertaken to further evaluate the patient's complaint of lingual tonsillar hypertrophy, globus sensation, throat pain with mirror exam inadequate for appropriate examination due to gag reflex and poor patient tolerance  Procedure:  Patient was identified as correct patient. Verbal consent was obtained. The nose was sprayed with oxymetazoline  and 4% lidocaine . The The flexible laryngoscope was passed through the nose to view the nasal cavity, pharynx (oropharynx, hypopharynx) and larynx.  The larynx was examined at rest and during multiple phonatory tasks. Documentation was obtained and reviewed with patient. The scope was removed. The patient tolerated the procedure well.  Findings: The nasal cavity and nasopharynx did not reveal any  masses or lesions, mucosa appeared to be without obvious lesions. The tongue base, pharyngeal walls, piriform sinuses, vallecula, epiglottis and postcricoid region are normal in appearance EXCEPT: lingual tonsillar hypertrophy present but not obstructive and does not rise above the level of the epiglottis. Right half of tip of epiglottis s/p excisional biopsy. No areas concerning for malignancy on exam. The visualized portion of the subglottis and proximal trachea is widely patent. The vocal folds are mobile bilaterally. There are no lesions on the free edge of the vocal folds nor elsewhere in the larynx worrisome for malignancy.    Electronically signed by: Hadassah JAYSON Parody, MD 06/25/2024 3:57 PM   Impression & Plans:   Nkenge Sonntag is a 53 y.o. female with   1. Lingual tonsil hypertrophy   2. Oropharyngeal dysphagia   3. Globus sensation   4. Throat pain    Assessment and Plan Assessment & Plan Postoperative oropharyngeal pain  following oropharyngeal surgery Persistent pain and dysphagia post-surgery likely due to swelling and healing. No infection or malignancy on path but there was one area of atypia at the epiglottis biopsy. I do not see any concerning findings one exam today.  - Scheduled follow-up in one month to monitor symptoms and healing.  Speech disturbance following oropharyngeal surgery Speech disturbance likely due to postoperative swelling. She mentions the area of the tongue that was removed playing a role and discussed that the tongue base is less likely to affect articulation compared to the tip of the tongue. - Monitor speech disturbance and reassess in follow-up appointment.  Dysphagia  Risk for aspiration due to partial epiglottis resection and tongue base resection Increased aspiration risk due to partial epiglottis resection. No infection. - Ordered swallow test to evaluate aspiration risk and determine need for dietary modifications. - Coordinate with speech therapist for swallow test and potential dietary recommendations.   See below regarding exact medications prescribed this encounter including dosages and route: No orders of the defined types were placed in this encounter.   Thank you for allowing me the opportunity to care for your patient. Please do not hesitate to contact me should you have any other questions.  Sincerely, Hadassah Parody, MD Otolaryngologist (ENT), [facility] Phone: [phone] Fax: [phone]

## 2024-06-28 ENCOUNTER — Ambulatory Visit: Admitting: Nurse Practitioner

## 2024-06-29 ENCOUNTER — Telehealth (INDEPENDENT_AMBULATORY_CARE_PROVIDER_SITE_OTHER): Payer: Self-pay

## 2024-06-29 NOTE — Telephone Encounter (Signed)
 Patient called and stated she had not heard from anyone regarding the referral to Speech Therapy and to have a swallow test done.  She was calling to get the phone #s so that she could call and schedule the appointments.  She can be reached at [phone].

## 2024-07-01 ENCOUNTER — Other Ambulatory Visit: Payer: Self-pay

## 2024-07-01 ENCOUNTER — Emergency Department (HOSPITAL_BASED_OUTPATIENT_CLINIC_OR_DEPARTMENT_OTHER)
Admission: EM | Admit: 2024-07-01 | Discharge: 2024-07-01 | Disposition: A | Discharge status: Home / Self Care | Attending: Emergency Medicine | Admitting: Emergency Medicine

## 2024-07-01 ENCOUNTER — Encounter (HOSPITAL_BASED_OUTPATIENT_CLINIC_OR_DEPARTMENT_OTHER): Payer: Self-pay | Admitting: Emergency Medicine

## 2024-07-01 DIAGNOSIS — R09A2 Foreign body sensation, throat: Secondary | ICD-10-CM | POA: Diagnosis not present

## 2024-07-01 DIAGNOSIS — Z91040 Latex allergy status: Secondary | ICD-10-CM | POA: Diagnosis not present

## 2024-07-01 DIAGNOSIS — J029 Acute pharyngitis, unspecified: Secondary | ICD-10-CM | POA: Diagnosis present

## 2024-07-01 MED ORDER — LIDOCAINE VISCOUS HCL 2 % MT SOLN
15.0000 mL | Freq: Once | OROMUCOSAL | Status: AC
  Administered 2024-07-01: 15 mL via OROMUCOSAL
  Filled 2024-07-01: qty 15

## 2024-07-01 MED ORDER — DEXAMETHASONE SOD PHOSPHATE PF 10 MG/ML IJ SOLN
10.0000 mg | Freq: Once | INTRAMUSCULAR | Status: AC
  Administered 2024-07-01: 10 mg via INTRAMUSCULAR

## 2024-07-01 NOTE — ED Provider Notes (Signed)
 [place] EMERGENCY DEPARTMENT AT [facility] Provider Note   CSN: [id number] Arrival date & time: 07/01/24  1309     Patient presents with: Sore Throat and Post-op Problem   Becky King is a 53 y.o. female who presents emergency department with a chief complaint of sensation of something stuck in her throat.  Patient reports that she had a tonsillectomy and tissue biopsy in late September.  She reports that since that time she has had progressive worsening of a sensation of something being stuck in her throat and sometimes gags because she feels like she cannot get it out and she has an urgent sensation to clear her throat.  She denies voice change or pain.  She reports that her biopsy was negative for cancerous change.  She was seen and assessed by Dr.Masciello 6 days ago where she underwent a transnasal fiberoptic laryngoscopy she was found to have :tongue base, pharyngeal walls, piriform sinuses, vallecula, epiglottis and postcricoid region are normal in appearance EXCEPT: lingual tonsillar hypertrophy present but not obstructive and does not rise above the level of the epiglottis .  And was to follow-up in 1 month and then get scheduled for a swallow study.    Sore Throat       Prior to Admission medications   Medication Sig Start Date End Date Taking? Authorizing Provider  acetaminophen  (TYLENOL ) 500 MG tablet Take 1 tablet (500 mg total) by mouth every 6 (six) hours. Take every 6 hrs x 2-3 days then take every 6 hrs as needed 06/06/24   Soldatova, Liuba, MD  albuterol  (VENTOLIN  HFA) 108 (90 Base) MCG/ACT inhaler Inhale 1-2 puffs into the lungs every 6 (six) hours as needed for wheezing or shortness of breath.    [provider]  clobetasol cream (TEMOVATE) 0.05 % Apply 1 Application topically 2 (two) times daily as needed (psoriasis). 11/08/23   [provider]  Cyanocobalamin  (VITAMIN B 12 PO) Take 1 tablet by mouth daily.    [provider]  Emollient (EUCERIN) lotion Apply 1 Application topically as needed for dry skin.    [provider]  estradiol  (ESTRACE  VAGINAL) 0.1 MG/GM vaginal cream Place 1 g vaginally 2 (two) times a week. Patient taking differently: Place 1 g vaginally See admin instructions. Twice a week if needed for dryness 10/18/22   Prentiss Riggs A, NP  fexofenadine (ALLEGRA) 180 MG tablet Take 1 tablet (180 mg total) by mouth daily. 05/31/24   Soldatova, Liuba, MD  fluticasone  (FLONASE ) 50 MCG/ACT nasal spray Place 1 spray into both nostrils daily as needed for allergies or rhinitis.    [provider]  fluticasone  (FLONASE ) 50 MCG/ACT nasal spray Place 2 sprays into both nostrils daily. 05/31/24   Soldatova, Liuba, MD  ibuprofen  (ADVIL ) 600 MG tablet Take 1 tablet (600 mg total) by mouth every 6 (six) hours. Please take every 6 hrs, and stagger this medication 3 hrs apart from Tylenol  05/18/24   Soldatova, Liuba, MD  ibuprofen  (ADVIL ) 600 MG tablet Take 1 tablet (600 mg total) by mouth every 6 (six) hours. Please take every 6 hrs, and stagger this medication 3 hrs apart from Tylenol  06/06/24   Soldatova, Liuba, MD  oxyCODONE  (OXY IR/ROXICODONE ) 5 MG immediate release tablet Take 1 tablet (5 mg total) by mouth every 4 (four) hours as needed for severe pain (pain score 7-10). 05/23/24   Will Almarie MATSU, MD  pantoprazole (PROTONIX) 40 MG tablet Take 1 tablet (40 mg total) by mouth daily.  05/23/24 05/23/25  Will Almarie MATSU, MD  polyvinyl alcohol (LIQUIFILM TEARS) 1.4 % ophthalmic solution Place 1 drop into both eyes as needed for dry eyes. [employer] For dry eyes    [provider]  sulfamethoxazole-trimethoprim (BACTRIM DS) 800-160 MG tablet Take 1 tablet by mouth 2 (two) times daily. 05/23/24   Will Almarie MATSU, MD    Allergies: Latex    Review of Systems  Updated Vital Signs BP (!) 152/88 (BP Location: Right Arm)   Pulse 91   Temp 98.7 F (37.1 C) (Oral)    Resp 18   LMP 01/12/2018   SpO2 99%   Physical Exam Vitals and nursing note reviewed.  Constitutional:      General: She is not in acute distress.    Appearance: She is well-developed. She is not diaphoretic.  HENT:     Head: Normocephalic and atraumatic.     Right Ear: External ear normal.     Left Ear: External ear normal.     Nose: Nose normal.     Mouth/Throat:     Mouth: Mucous membranes are moist.     Comments: No visualized abnormalities at the base of the tongue posterior oropharynx or pharyngeal arch, uvula is midline without swelling.  Normal phonation Eyes:     General: No scleral icterus.    Conjunctiva/sclera: Conjunctivae normal.  Cardiovascular:     Rate and Rhythm: Normal rate and regular rhythm.     Heart sounds: Normal heart sounds. No murmur heard.    No friction rub. No gallop.  Pulmonary:     Effort: Pulmonary effort is normal. No respiratory distress.     Breath sounds: Normal breath sounds.  Abdominal:     General: Bowel sounds are normal. There is no distension.     Palpations: Abdomen is soft. There is no mass.     Tenderness: There is no abdominal tenderness. There is no guarding.  Musculoskeletal:     Cervical back: Normal range of motion.  Skin:    General: Skin is warm and dry.  Neurological:     Mental Status: She is alert and oriented to person, place, and time.  Psychiatric:        Behavior: Behavior normal.     (all labs ordered are listed, but only abnormal results are displayed) Labs Reviewed - No data to display  EKG: None  Radiology: No results found.   Procedures   Medications Ordered in the ED  lidocaine  (XYLOCAINE ) 2 % viscous mouth solution 15 mL (15 mLs Mouth/Throat Given 07/01/24 1452)    Clinical Course as of 07/01/24 1544  Sun Jul 01, 2024  1537  [AH]    Clinical Course User Index [AH] Arloa Chroman, PA-C                                 Medical Decision Making Risk Prescription drug  management.   Patient with complaints of persistent globus sensation.  She did get some relief with viscous lidocaine .  As per previous note she does have some area of swelling and inflammation is likely the cause of her persistent abnormal sensation.  Patient would like to try a shot of Decadron  which may relieve some of the swelling and discomfort in the back of her throat.  Otherwise she is supposed to be scheduled for swallow study.  Discussed outpatient follow-up and return precautions.  She appears appropriate for discharge  at this time without red flag symptoms or concerns for deep space infection of the neck, tenderness along the anterior neck, no hot potato voice and is able to swallow fluids appropriately.     Final diagnoses:  None    ED Discharge Orders     None          Arloa Chroman, PA-C 07/01/24 1547    Dean Clarity, MD 07/02/24 [phone]

## 2024-07-01 NOTE — ED Triage Notes (Signed)
 Reports feeling like something is stuck: in back of throat. Had tonsil removed at end of sept. Denies fevers. No issues with handling secretions.

## 2024-07-01 NOTE — Discharge Instructions (Signed)
 Follow-up with your ear nose and throat doctor.  Get help right away if you are unable to swallow your own saliva, have severe pain in your neck, high fever or significant change in voice.

## 2024-07-04 NOTE — Addendum Note (Signed)
 Addended by: GREGGORY IP on: 07/04/2024 11:00 AM   Modules accepted: Orders

## 2024-07-05 ENCOUNTER — Other Ambulatory Visit (INDEPENDENT_AMBULATORY_CARE_PROVIDER_SITE_OTHER): Payer: Self-pay

## 2024-07-05 ENCOUNTER — Telehealth (INDEPENDENT_AMBULATORY_CARE_PROVIDER_SITE_OTHER): Payer: Self-pay

## 2024-07-05 DIAGNOSIS — R1312 Dysphagia, oropharyngeal phase: Secondary | ICD-10-CM

## 2024-07-05 NOTE — Telephone Encounter (Signed)
 Becky King

## 2024-07-27 ENCOUNTER — Ambulatory Visit (INDEPENDENT_AMBULATORY_CARE_PROVIDER_SITE_OTHER)

## 2024-08-02 ENCOUNTER — Inpatient Hospital Stay (HOSPITAL_COMMUNITY): Admission: RE | Admit: 2024-08-02 | Discharge: 2024-08-02 | Disposition: A | Source: Ambulatory Visit

## 2024-08-02 ENCOUNTER — Ambulatory Visit (HOSPITAL_COMMUNITY)
Admission: RE | Admit: 2024-08-02 | Discharge: 2024-08-02 | Disposition: A | Discharge status: Home / Self Care | Source: Ambulatory Visit | Attending: *Deleted | Admitting: *Deleted

## 2024-08-02 DIAGNOSIS — R1312 Dysphagia, oropharyngeal phase: Secondary | ICD-10-CM | POA: Diagnosis present

## 2024-08-02 NOTE — Procedures (Addendum)
 Objective Swallowing Evaluation: Type of Study: FEES-Fiberoptic Endoscopic Evaluation of Swallow   Patient Details  Name: Becky King MRN: [id number] Date of Birth: 1970/09/04  Today's Date: 08/02/2024 Time: SLP Start Time (ACUTE ONLY): 1000 -SLP Stop Time (ACUTE ONLY): 1040  SLP Time Calculation (min) (ACUTE ONLY): 40 min   Past Medical History:  Past Medical History:  Diagnosis Date   Allergy    Anemia    Asthma    HAS MDI   Carpal tunnel syndrome    CARPAL TUNNEL WITH SURGERY    Gallstone pancreatitis 2025   Kidney stones    PONV (postoperative nausea and vomiting)    Seasonal allergies    Vitamin B12 deficiency 07/30/2019   Past Surgical History:  Past Surgical History:  Procedure Laterality Date   APPENDECTOMY     CHOLECYSTECTOMY N/A 01/14/2024   Procedure: LAPAROSCOPIC CHOLECYSTECTOMY WITH INTRAOPERATIVE CHOLANGIOGRAM;  Surgeon: Polly Cordella LABOR, MD;  Location: [facility] OR;  Service: General;  Laterality: N/A;   HERNIA REPAIR     RIGID BRONCHOSCOPY N/A 05/18/2024   Procedure: BRONCHOSCOPY, RIGID;  Surgeon: Okey Burns, MD;  Location: [facility] OR;  Service: ENT;  Laterality: N/A;  DML bronchoscopy biopsy of the base of the tongue   TONSILLECTOMY Bilateral 05/18/2024   Procedure: TONSILLECTOMY;  Surgeon: Okey Burns, MD;  Location: [facility] OR;  Service: ENT;  Laterality: Bilateral;   TUBAL LIGATION     HPI: [name] is a 53 y.o. female who arrivies for an OP FEES, referred by ENT. Pt has a recent history of globus. Problem started after she initially sought medical attention after falling down stairs, resulting in shoulder pain. During the evaluation, a CT scan of cervical spine was performed, revealing prominent soft tissue along vallecula. No sensation of throat swelling or neck lump has been experienced in the past. On 05/18/2024 pt underwent lingual tonsil biopsy for lingual tonsil hypertrophy asymmetry on the left and bilateral tonsillectomy for palatine  tonsillar hypertrophy. After surgery she returned to ENT due to dysphagia on 9/30, 10/9, and 11/03.  Pt reported coughing with liquids and regurgitating liquids, nasal regurgitation,  drunk sounding speech, burning sensation in throat.  FEES scheduled. Upon arrival for FEES pt reports symptoms have improved significantly since September. She reports her speech is 95% better, she still has some liquids feel like they are going up her nose and a feeling of liquids and pudding like textures clinging in her throat. She describes her throat as feeling like a sponge.  She felt that the decadron  injection she got from the PA at [facility] during an ED visit was specifically helpful.   No data recorded   Assessment / Plan / Recommendation     08/02/2024   10:00 AM  Clinical Impressions  Clinical Impression Pt demonstrates a moderate oropharyngeal dysphagia with decreased swallowing efficiency, but good airway protection. Pt has anatomical variants in the lingular tonsils and vallecular area. Pt also potentially has decreased velopharyngeal to posterior pharyngeal wall tension and/or base of tongue to pharyngeal wall propulsion (these are difficult to quantify on FEES). Pts lingular tonsil tissue is thick with thick ridges and deep sulci, very different from typical BOT tissue. Question if this is baseline or if there is an underlying inflammatory or surgical change. No preoperative images are available for comparison, but current images included below. Pt has significant liquid and solid food residue in this region as well as in the posterior pharyngeal wall and up into the nasopharynx, but not in the  nasal cavity. A chin tuck INCREASED residue. A posterior head tilt with multiple liquid washes slightly decreased residue.   Given pt report that she feels much improved, especially after steriods, suspect post surgical inflammatory change at play. Recommend posterior head tilt, liquid rinse and possibly warm  salt water rinse/gargle after meals to clear particulates from tissue as needed. If problem persists, recommend f/u with Videofluoroscopic swallow study (MBS) for lateral view of oropharyngeal mechanism and clearer evaluation of weakness or decreased ROM in upper oropharynx. If weakness is present, pt may benefit from further SLP therapy for an exercise program. No f/u recommended currently.     SLP Visit Diagnosis Dysphagia, oropharyngeal phase (R13.12)  Attention and concentration deficit following --  Frontal lobe and executive function deficit following --  Impact on safety and function Mild aspiration risk     Liquid residue pooling in sulci in lingular tonsil - pt needs several swallows and some hocking' to fully clear.    Liquid and cracker particulates in lingular tonsil      Moderate residue on posterior upper oropharyngeal wall   Mild residue on nasopharyngeal wall - pt concurrently reports 'burning' and its going up my nose              08/02/2024   10:00 AM  Treatment Recommendations  Treatment Recommendations Other (Comment) if problem persists, f/u with MBS. Pt may benefit from f/u with OP SLP if weakness is determined.         08/02/2024   10:00 AM  Prognosis  Prognosis for improved oropharyngeal function Good  Barriers to Reach Goals Time post onset  Barriers/Prognosis Comment --    Swallow Evaluation Recommendations Recommendations: PO diet PO Diet Recommendation: Regular;Thin liquids (Level 0) Liquid Administration via: Straw;Cup Medication Administration: Whole meds with liquid Supervision: Patient able to self-feed Postural changes:  (posterior head tilt) Oral care recommendations:  (Oral care after PO)          08/02/2024   10:00 AM  Other Recommendations  Recommended Consults --  Oral Care Recommendations Oral care before and after PO  Caregiver Recommendations --  Follow Up Recommendations --  Assistance recommended at  discharge --  Functional Status Assessment --        No data to display               08/02/2024   10:00 AM  Oral Phase  Oral Phase WFL  Oral - Pudding Teaspoon --  Oral - Pudding Cup --  Oral - Honey Teaspoon --  Oral - Honey Cup --  Oral - Nectar Teaspoon --  Oral - Nectar Cup --  Oral - Nectar Straw --  Oral - Thin Teaspoon --  Oral - Thin Cup --  Oral - Thin Straw --  Oral - Puree --  Oral - Mech Soft --  Oral - Regular --  Oral - Multi-Consistency --  Oral - Pill --  Oral Phase - Comment --       08/02/2024   10:00 AM  Pharyngeal Phase  Pharyngeal Phase Impaired  Pharyngeal- Pudding Teaspoon --  Pharyngeal --  Pharyngeal- Pudding Cup --  Pharyngeal --  Pharyngeal- Honey Teaspoon --  Pharyngeal --  Pharyngeal- Honey Cup --  Pharyngeal --  Pharyngeal- Nectar Teaspoon --  Pharyngeal --  Pharyngeal- Nectar Cup --  Pharyngeal --  Pharyngeal- Nectar Straw --  Pharyngeal --  Pharyngeal- Thin Teaspoon NT  Pharyngeal --  Pharyngeal- Thin Cup Reduced pharyngeal peristalsis;Reduced tongue base  retraction;Pharyngeal residue - posterior pharnyx;Nasopharyngeal reflux  Pharyngeal Material does not enter airway  Pharyngeal- Thin Straw Reduced pharyngeal peristalsis;Reduced tongue base retraction;Pharyngeal residue - posterior pharnyx;Nasopharyngeal reflux  Pharyngeal Material does not enter airway  Pharyngeal- Puree Reduced pharyngeal peristalsis;Reduced tongue base retraction;Pharyngeal residue - posterior pharnyx;Nasopharyngeal reflux  Pharyngeal --  Pharyngeal- Mechanical Soft Reduced pharyngeal peristalsis;Reduced tongue base retraction;Pharyngeal residue - posterior pharnyx;Nasopharyngeal reflux  Pharyngeal --  Pharyngeal- Regular --  Pharyngeal --  Pharyngeal- Multi-consistency --  Pharyngeal --  Pharyngeal- Pill --  Pharyngeal --  Pharyngeal Comment --         No data to display           Mykah Shin, Consuelo Fitch 08/02/2024, 12:39 PM

## 2024-08-08 ENCOUNTER — Ambulatory Visit (INDEPENDENT_AMBULATORY_CARE_PROVIDER_SITE_OTHER)

## 2024-08-08 ENCOUNTER — Encounter (INDEPENDENT_AMBULATORY_CARE_PROVIDER_SITE_OTHER): Payer: Self-pay

## 2024-08-08 VITALS — BP 91/60 | HR 79 | Temp 97.9°F | Ht 63.0 in | Wt 190.0 lb

## 2024-08-08 DIAGNOSIS — L409 Psoriasis, unspecified: Secondary | ICD-10-CM

## 2024-08-08 DIAGNOSIS — R1312 Dysphagia, oropharyngeal phase: Secondary | ICD-10-CM | POA: Diagnosis not present

## 2024-08-08 DIAGNOSIS — J309 Allergic rhinitis, unspecified: Secondary | ICD-10-CM

## 2024-08-08 DIAGNOSIS — J351 Hypertrophy of tonsils: Secondary | ICD-10-CM

## 2024-08-08 MED ORDER — CLOBETASOL PROPIONATE 0.05 % EX OINT: TOPICAL_OINTMENT | CUTANEOUS | 0 refills | Status: AC

## 2024-08-08 MED ORDER — TRIAMCINOLONE ACETONIDE 55 MCG/ACT NA AERO: 2.0000 | INHALATION_SPRAY | Freq: Every day | NASAL | 12 refills | Status: AC

## 2024-08-08 NOTE — Progress Notes (Signed)
 Dear Dr. Rexford ref. provider found, Here is my assessment for our mutual patient, Becky King. Thank you for allowing me the opportunity to care for your patient. Please do not hesitate to contact me should you have any other questions. Sincerely, Dr. Hadassah Parody  Otolaryngology Clinic Note Referring provider: Dr. Rexford ref. provider found HPI:    (06/25/2024) Becky King is a 52 y.o. female presenting for evaluation of lingual tonsillar hypertrophy.   Prior pt of Dr. Soldatova.   Initially taken to OR on 05/18/24 by Liuba Soldatova for tonsillectomy and DL bx of epiglottis and tongue base. She then came to ED on 05/22/24 with concern for epiglottitis after biopsies. No OR at that time. Biopsies in OR were negative but did state that epiglottis biopsy showed some cytologic atypia and acute inflammation, overall thought to represent reactive hyperplasia.    Discussed the use of AI scribe software for clinical note transcription with the patient, who gave verbal consent to proceed.  Today seeing me for first time. Reports she still has pain at times and feels like something stuck in her throat.   Dysphagia - Swallowing difficulties following recent tonsil surgery and biopsy - Difficulty swallowing liquids, with regurgitation through the nose - Throat described as a 'sponge' that fills up and causes liquids to come back out - Solid foods do not cause issues - No swallow test performed since surgery - Initially coughed up tissue postoperatively, now only coughs with plain liquid  Perceived dysarthria  - Persistent change in voice since surgery - when asked more about this sound more like dysarthria than dysphonia  - Voice described as slurred and 'drunk'-sounding - Feels like this impairs her ability to communicate, especially over the phone  Oropharyngeal pain and sensations - Occasional burning pain in the back of the throat - Frequent spitting, currently feels dry -  Persistent sensation of something stuck in the throat - no ear pain and pain is not severe   Pain management - Alternates ibuprofen  and Tylenol  for pain control  History of Present Illness --------------------------------------------------------- 08/08/2024   Returns for follow-up after FEES. They recommended MBS if continues having symptoms but at this time was given a p.o. diet. She was seen in ED on 07/01/24 due to concern for globus sensation. Was given decadron    Since she was seen in the emergency department and given Decadron  she has been feeling significant improvement.  Almost back to baseline, 99%.  No longer experiences sensation of something stuck in the throat or spitting or choking sensations.  Dermatologic symptoms affecting ears - History of eczema and psoriasis involving the ears over the years  - Uses Clobetasol  cream for management - Has not applied cream inside the ears   Allergic rhinitis management Nasal obstruction - Prescribed Flonase  for symptom management - Insurance does not cover Flonase ; seeking alternative covered options - Uses Flonase  when available   Independent Review of Additional Tests or Records:  Surg path 05/18/24 reviewed and discussed above  Op note Elena Larry 05/18/24 reviewed and discussed above.   CT C spine 03/23/24 independently reviewed and shows soft tissue mass at base of tongue/epiglottis   ED note 07/01/24 reviewed; given decadron  for globus sensation   Fees 08/02/2024 reviewed and discussed above PMH/Meds/All/SocHx/FamHx/ROS:   Past Medical History:  Diagnosis Date   Allergy    Anemia    Asthma    HAS MDI   Carpal tunnel syndrome    CARPAL TUNNEL WITH SURGERY    Gallstone  pancreatitis 2025   Kidney stones    PONV (postoperative nausea and vomiting)    Seasonal allergies    Vitamin B12 deficiency 07/30/2019     Past Surgical History:  Procedure Laterality Date   APPENDECTOMY     CHOLECYSTECTOMY N/A 01/14/2024    Procedure: LAPAROSCOPIC CHOLECYSTECTOMY WITH INTRAOPERATIVE CHOLANGIOGRAM;  Surgeon: Polly Cordella LABOR, MD;  [facility]: [facility];  Service: General;  Laterality: N/A;   HERNIA REPAIR     RIGID BRONCHOSCOPY N/A 05/18/2024   Procedure: BRONCHOSCOPY, RIGID;  Surgeon: Okey Burns, MD;  Location: [facility] OR;  Service: ENT;  Laterality: N/A;  DML bronchoscopy biopsy of the base of the tongue   TONSILLECTOMY Bilateral 05/18/2024   Procedure: TONSILLECTOMY;  Surgeon: Okey Burns, MD;  Location: [facility] OR;  Service: ENT;  Laterality: Bilateral;   TUBAL LIGATION      Family History  Problem Relation Age of Onset   Fibromyalgia Mother    Heart disease Mother    Colon polyps Mother    Diabetes Maternal Grandmother    Colon cancer Neg Hx    Esophageal cancer Neg Hx    Stomach cancer Neg Hx    Rectal cancer Neg Hx      Social Connections: Moderately Isolated (01/13/2024)   Social Connection and Isolation Panel    Frequency of Communication with Friends and Family: More than three times a week    Frequency of Social Gatherings with Friends and Family: Three times a week    Attends Religious Services: Never    Active Member of Clubs or Organizations: No    Attends Engineer, Structural: More than 4 times per year    Marital Status: Never married     Current Outpatient Medications  Medication Instructions   acetaminophen  (TYLENOL ) 500 mg, Oral, Every 6 hours, Take every 6 hrs x 2-3 days then take every 6 hrs as needed   albuterol  (VENTOLIN  HFA) 108 (90 Base) MCG/ACT inhaler 1-2 puffs, Every 6 hours PRN   clobetasol  cream (TEMOVATE ) 0.05 % 1 Application, 2 times daily PRN   Cyanocobalamin  (VITAMIN B 12 PO) 1 tablet, Daily   Emollient (EUCERIN) lotion 1 Application, As needed   estradiol  (ESTRACE  VAGINAL) 1 g, Vaginal, 2 times weekly   fexofenadine  (ALLEGRA ) 180 mg, Oral, Daily   fluticasone  (FLONASE ) 50 MCG/ACT nasal spray 1 spray, Daily PRN   fluticasone  (FLONASE ) 50 MCG/ACT nasal spray  2 sprays, Each Nare, Daily   ibuprofen  (ADVIL ) 600 mg, Oral, Every 6 hours, Please take every 6 hrs, and stagger this medication 3 hrs apart from Tylenol    ibuprofen  (ADVIL ) 600 mg, Oral, Every 6 hours, Please take every 6 hrs, and stagger this medication 3 hrs apart from Tylenol    oxyCODONE  (OXY IR/ROXICODONE ) 5 mg, Oral, Every 4 hours PRN   pantoprazole  (PROTONIX ) 40 mg, Oral, Daily   polyvinyl alcohol (LIQUIFILM TEARS) 1.4 % ophthalmic solution 1 drop, As needed   sulfamethoxazole -trimethoprim  (BACTRIM  DS) 800-160 MG tablet 1 tablet, Oral, 2 times daily     Physical Exam:   LMP 01/12/2018   Salient findings:  CN II-XII intact Bilateral EAC with scaling and flaking from psoriasis. TM intact bilaterally.  No dysarthria today  No lesions of oral cavity/oropharynx No obviously palpable neck masses/lymphadenopathy/thyromegaly No respiratory distress or stridor   Seprately Identifiable Procedures:  Prior to initiating any procedures, risks/benefits/alternatives were explained to the patient and verbal consent obtained. None    Impression & Plans:  Tirzah Fross is a 53 y.o. female with  No diagnosis found.  Assessment and Plan Assessment & Plan Postoperative oropharyngeal pain  following oropharyngeal surgery Lingual tonsillar hypertrophy  Persistent pain and dysphagia post-surgery likely due to swelling and healing. No infection or malignancy on path but there was one area of atypia at the epiglottis biopsy. Last scope exam w/o concerning findings. Deferred today.  - Follow-up in 3-4 months for repeat evaluation and ensure no regrowth in area of concern   Dysphagia  Risk for aspiration due to partial epiglottis resection and tongue base resection Increased aspiration risk due to partial epiglottis resection. No infection. 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 Alternative treatment needed. - Prescribed Nasacort  as an alternative nasal spray.   See below regarding exact medications prescribed this encounter including dosages and route: No orders of the defined types were placed in this encounter.   Thank you for allowing me the opportunity to care for your patient. Please do not hesitate to contact me should you have any other questions.  Sincerely, Hadassah Parody, MD Otolaryngologist (ENT), [facility] Phone: [phone] Fax: [phone]  MDM:  Level 4 Complexity/Problems addressed:4- 2 chronic problems  Data complexity: 3 independent review of 2 notes - Morbidity: 4- prescription drug management  - Prescription Drug prescribed or managed: yes

## 2024-12-05 ENCOUNTER — Ambulatory Visit (INDEPENDENT_AMBULATORY_CARE_PROVIDER_SITE_OTHER)
